# Patient Record
Sex: Male | Born: 1945 | Race: White | Hispanic: Yes | Smoking: Never smoker
Health system: Southern US, Community
[De-identification: ages and names within clinical notes are randomized; demographics above are authoritative.]

## PROBLEM LIST (undated history)

## (undated) DIAGNOSIS — I10 Essential (primary) hypertension: Secondary | ICD-10-CM

## (undated) DIAGNOSIS — I255 Ischemic cardiomyopathy: Secondary | ICD-10-CM

## (undated) DIAGNOSIS — E119 Type 2 diabetes mellitus without complications: Secondary | ICD-10-CM

## (undated) DIAGNOSIS — I2510 Atherosclerotic heart disease of native coronary artery without angina pectoris: Secondary | ICD-10-CM

## (undated) DIAGNOSIS — I350 Nonrheumatic aortic (valve) stenosis: Secondary | ICD-10-CM

## (undated) DIAGNOSIS — I639 Cerebral infarction, unspecified: Secondary | ICD-10-CM

## (undated) DIAGNOSIS — E785 Hyperlipidemia, unspecified: Secondary | ICD-10-CM

---

## 2014-02-28 ENCOUNTER — Encounter (HOSPITAL_BASED_OUTPATIENT_CLINIC_OR_DEPARTMENT_OTHER): Payer: Self-pay

## 2014-02-28 ENCOUNTER — Emergency Department (HOSPITAL_BASED_OUTPATIENT_CLINIC_OR_DEPARTMENT_OTHER): Payer: Medicare Other

## 2014-02-28 ENCOUNTER — Inpatient Hospital Stay (HOSPITAL_BASED_OUTPATIENT_CLINIC_OR_DEPARTMENT_OTHER)
Admission: EM | Admit: 2014-02-28 | Discharge: 2014-03-05 | DRG: 247 | Disposition: A | Discharge status: Skilled Nursing Facility | Payer: Medicare Other | Attending: Cardiology | Admitting: Cardiology

## 2014-02-28 DIAGNOSIS — I350 Nonrheumatic aortic (valve) stenosis: Secondary | ICD-10-CM

## 2014-02-28 DIAGNOSIS — I255 Ischemic cardiomyopathy: Secondary | ICD-10-CM | POA: Diagnosis present

## 2014-02-28 DIAGNOSIS — I214 Non-ST elevation (NSTEMI) myocardial infarction: Principal | ICD-10-CM | POA: Diagnosis present

## 2014-02-28 DIAGNOSIS — Z8673 Personal history of transient ischemic attack (TIA), and cerebral infarction without residual deficits: Secondary | ICD-10-CM | POA: Diagnosis not present

## 2014-02-28 DIAGNOSIS — Z79899 Other long term (current) drug therapy: Secondary | ICD-10-CM | POA: Diagnosis not present

## 2014-02-28 DIAGNOSIS — I420 Dilated cardiomyopathy: Secondary | ICD-10-CM | POA: Diagnosis present

## 2014-02-28 DIAGNOSIS — R1310 Dysphagia, unspecified: Secondary | ICD-10-CM | POA: Diagnosis present

## 2014-02-28 DIAGNOSIS — I252 Old myocardial infarction: Secondary | ICD-10-CM

## 2014-02-28 DIAGNOSIS — Z7982 Long term (current) use of aspirin: Secondary | ICD-10-CM | POA: Diagnosis not present

## 2014-02-28 DIAGNOSIS — E119 Type 2 diabetes mellitus without complications: Secondary | ICD-10-CM | POA: Diagnosis present

## 2014-02-28 DIAGNOSIS — Z993 Dependence on wheelchair: Secondary | ICD-10-CM | POA: Diagnosis not present

## 2014-02-28 DIAGNOSIS — R079 Chest pain, unspecified: Secondary | ICD-10-CM

## 2014-02-28 DIAGNOSIS — I639 Cerebral infarction, unspecified: Secondary | ICD-10-CM | POA: Diagnosis present

## 2014-02-28 DIAGNOSIS — I10 Essential (primary) hypertension: Secondary | ICD-10-CM | POA: Diagnosis present

## 2014-02-28 DIAGNOSIS — E785 Hyperlipidemia, unspecified: Secondary | ICD-10-CM

## 2014-02-28 DIAGNOSIS — R262 Difficulty in walking, not elsewhere classified: Secondary | ICD-10-CM | POA: Diagnosis present

## 2014-02-28 DIAGNOSIS — I160 Hypertensive urgency: Secondary | ICD-10-CM

## 2014-02-28 DIAGNOSIS — G8190 Hemiplegia, unspecified affecting unspecified side: Secondary | ICD-10-CM | POA: Diagnosis present

## 2014-02-28 DIAGNOSIS — I25110 Atherosclerotic heart disease of native coronary artery with unstable angina pectoris: Secondary | ICD-10-CM | POA: Diagnosis present

## 2014-02-28 DIAGNOSIS — R001 Bradycardia, unspecified: Secondary | ICD-10-CM | POA: Diagnosis present

## 2014-02-28 DIAGNOSIS — I2510 Atherosclerotic heart disease of native coronary artery without angina pectoris: Secondary | ICD-10-CM | POA: Diagnosis present

## 2014-02-28 DIAGNOSIS — I249 Acute ischemic heart disease, unspecified: Secondary | ICD-10-CM

## 2014-02-28 HISTORY — DX: Type 2 diabetes mellitus without complications: E11.9

## 2014-02-28 HISTORY — DX: Atherosclerotic heart disease of native coronary artery without angina pectoris: I25.10

## 2014-02-28 HISTORY — DX: Hyperlipidemia, unspecified: E78.5

## 2014-02-28 HISTORY — DX: Nonrheumatic aortic (valve) stenosis: I35.0

## 2014-02-28 HISTORY — DX: Cerebral infarction, unspecified: I63.9

## 2014-02-28 HISTORY — DX: Ischemic cardiomyopathy: I25.5

## 2014-02-28 HISTORY — DX: Essential (primary) hypertension: I10

## 2014-02-28 LAB — COMPREHENSIVE METABOLIC PANEL
ALBUMIN: 4.7 g/dL (ref 3.5–5.2)
ALK PHOS: 116 U/L (ref 39–117)
ALT: 17 U/L (ref 0–53)
ANION GAP: 6 (ref 5–15)
AST: 24 U/L (ref 0–37)
BUN: 16 mg/dL (ref 6–23)
CALCIUM: 9.7 mg/dL (ref 8.4–10.5)
CHLORIDE: 102 meq/L (ref 96–112)
CO2: 31 mmol/L (ref 19–32)
CREATININE: 0.64 mg/dL (ref 0.50–1.35)
GFR calc Af Amer: >90 mL/min (ref >90)
GFR calc non Af Amer: >90 mL/min (ref >90)
Glucose, Bld: 99 mg/dL (ref 70–99)
Potassium: 4 mmol/L (ref 3.5–5.1)
SODIUM: 139 mmol/L (ref 135–145)
TOTAL PROTEIN: 9.1 g/dL — AB (ref 6.0–8.3)
Total Bilirubin: 0.8 mg/dL (ref 0.3–1.2)

## 2014-02-28 LAB — CBC
HCT: 39.5 % (ref 39.0–52.0)
HEMOGLOBIN: 13 g/dL (ref 13.0–17.0)
MCH: 27.9 pg (ref 26.0–34.0)
MCHC: 32.9 g/dL (ref 30.0–36.0)
MCV: 84.8 fL (ref 78.0–100.0)
Platelets: 223 10*3/uL (ref 150–400)
RBC: 4.66 MIL/uL (ref 4.22–5.81)
RDW: 15.7 % — ABNORMAL HIGH (ref 11.5–15.5)
WBC: 10.4 10*3/uL (ref 4.0–10.5)

## 2014-02-28 LAB — TROPONIN I
Troponin I: 0.08 ng/mL — ABNORMAL HIGH (ref <0.031)
Troponin I: 0.06 ng/mL — ABNORMAL HIGH (ref <0.031)

## 2014-02-28 LAB — CK TOTAL AND CKMB (NOT AT ARMC)
CK TOTAL: 46 U/L (ref 7–232)
CK, MB: 1.7 ng/mL (ref 0.3–4.0)
Relative Index: INVALID (ref 0.0–2.5)

## 2014-02-28 MED ORDER — NITROGLYCERIN 0.4 MG SL SUBL
0.4000 mg | SUBLINGUAL_TABLET | SUBLINGUAL | Status: DC | PRN
  Administered 2014-02-28: 0.4 mg via SUBLINGUAL

## 2014-02-28 MED ORDER — ATORVASTATIN CALCIUM 80 MG PO TABS
80.0000 mg | ORAL_TABLET | Freq: Every day | ORAL | Status: DC
  Administered 2014-03-01 – 2014-03-04 (×4): 80 mg via ORAL
  Filled 2014-02-28 (×5): qty 1

## 2014-02-28 MED ORDER — ACETAMINOPHEN 325 MG PO TABS: 650.0000 mg | ORAL_TABLET | ORAL | Status: DC | PRN

## 2014-02-28 MED ORDER — NITROGLYCERIN 0.4 MG SL SUBL: 0.4000 mg | SUBLINGUAL_TABLET | SUBLINGUAL | Status: DC | PRN

## 2014-02-28 MED ORDER — NITROGLYCERIN IN D5W 200-5 MCG/ML-% IV SOLN
INTRAVENOUS | Status: AC
  Filled 2014-02-28: qty 250

## 2014-02-28 MED ORDER — HYDRALAZINE HCL 25 MG PO TABS
12.5000 mg | ORAL_TABLET | Freq: Three times a day (TID) | ORAL | Status: DC
Start: 2014-03-01 — End: 2014-03-05
  Administered 2014-03-01 – 2014-03-05 (×12): 12.5 mg via ORAL
  Filled 2014-02-28: qty 0.5
  Filled 2014-02-28: qty 1
  Filled 2014-02-28 (×3): qty 0.5
  Filled 2014-02-28 (×3): qty 1
  Filled 2014-02-28: qty 0.5
  Filled 2014-02-28: qty 1
  Filled 2014-02-28 (×3): qty 0.5
  Filled 2014-02-28 (×2): qty 1
  Filled 2014-02-28: qty 0.5

## 2014-02-28 MED ORDER — ONDANSETRON HCL 4 MG/2ML IJ SOLN: 4.0000 mg | Freq: Four times a day (QID) | INTRAMUSCULAR | Status: DC | PRN

## 2014-02-28 MED ORDER — CLOPIDOGREL BISULFATE 75 MG PO TABS
600.0000 mg | ORAL_TABLET | Freq: Once | ORAL | Status: AC
  Administered 2014-02-28: 600 mg via ORAL
  Filled 2014-02-28 (×2): qty 8

## 2014-02-28 MED ORDER — SERTRALINE HCL 100 MG PO TABS
100.0000 mg | ORAL_TABLET | Freq: Every day | ORAL | Status: DC
  Administered 2014-03-01 – 2014-03-05 (×5): 100 mg via ORAL
  Filled 2014-02-28 (×5): qty 1

## 2014-02-28 MED ORDER — HEPARIN (PORCINE) IN NACL 100-0.45 UNIT/ML-% IJ SOLN
INTRAMUSCULAR | Status: AC
  Filled 2014-02-28: qty 250

## 2014-02-28 MED ORDER — HYDRALAZINE HCL 25 MG PO TABS
25.0000 mg | ORAL_TABLET | Freq: Three times a day (TID) | ORAL | Status: DC
  Administered 2014-02-28: 12.5 mg via ORAL
  Filled 2014-02-28: qty 1

## 2014-02-28 MED ORDER — ASPIRIN 81 MG PO CHEW
324.0000 mg | CHEWABLE_TABLET | Freq: Once | ORAL | Status: AC
  Administered 2014-02-28: 324 mg via ORAL

## 2014-02-28 MED ORDER — ENALAPRIL MALEATE 20 MG PO TABS
20.0000 mg | ORAL_TABLET | Freq: Two times a day (BID) | ORAL | Status: DC
  Administered 2014-02-28 – 2014-03-05 (×9): 20 mg via ORAL
  Filled 2014-02-28: qty 1
  Filled 2014-02-28: qty 4
  Filled 2014-02-28 (×2): qty 1
  Filled 2014-02-28: qty 4
  Filled 2014-02-28: qty 1
  Filled 2014-02-28 (×3): qty 4
  Filled 2014-02-28: qty 1
  Filled 2014-02-28: qty 8
  Filled 2014-02-28: qty 4

## 2014-02-28 MED ORDER — NITROGLYCERIN IN D5W 200-5 MCG/ML-% IV SOLN
5.0000 ug/min | Freq: Once | INTRAVENOUS | Status: AC
  Administered 2014-02-28: 5 ug/min via INTRAVENOUS

## 2014-02-28 MED ORDER — HEPARIN BOLUS VIA INFUSION
4000.0000 [IU] | Freq: Once | INTRAVENOUS | Status: AC
  Administered 2014-02-28 (×2): 4000 [IU] via INTRAVENOUS

## 2014-02-28 MED ORDER — ASPIRIN EC 81 MG PO TBEC
81.0000 mg | DELAYED_RELEASE_TABLET | Freq: Every day | ORAL | Status: DC
  Administered 2014-03-01 – 2014-03-05 (×5): 81 mg via ORAL
  Filled 2014-02-28 (×5): qty 1

## 2014-02-28 MED ORDER — CLOPIDOGREL BISULFATE 75 MG PO TABS
75.0000 mg | ORAL_TABLET | Freq: Every day | ORAL | Status: DC
  Administered 2014-03-01 – 2014-03-03 (×3): 75 mg via ORAL
  Filled 2014-02-28 (×3): qty 1

## 2014-02-28 MED ORDER — ASPIRIN 81 MG PO CHEW
CHEWABLE_TABLET | ORAL | Status: AC
  Filled 2014-02-28: qty 4

## 2014-02-28 MED ORDER — NITROGLYCERIN 0.4 MG SL SUBL
SUBLINGUAL_TABLET | SUBLINGUAL | Status: AC
  Filled 2014-02-28: qty 1

## 2014-02-28 MED ORDER — HEPARIN (PORCINE) IN NACL 100-0.45 UNIT/ML-% IJ SOLN
1100.0000 [IU]/h | INTRAMUSCULAR | Status: DC
  Administered 2014-02-28: 900 [IU]/h via INTRAVENOUS
  Administered 2014-03-01: 1100 [IU]/h via INTRAVENOUS
  Filled 2014-02-28 (×3): qty 250

## 2014-02-28 MED ORDER — ALLOPURINOL 300 MG PO TABS
300.0000 mg | ORAL_TABLET | Freq: Every day | ORAL | Status: DC
  Administered 2014-03-01 – 2014-03-05 (×5): 300 mg via ORAL
  Filled 2014-02-28 (×5): qty 1

## 2014-02-28 NOTE — ED Notes (Signed)
Attempt to call report nurse not available 

## 2014-02-28 NOTE — ED Notes (Signed)
MD at bedside. 

## 2014-02-28 NOTE — ED Provider Notes (Addendum)
CSN: [id number]     Arrival date & time 02/28/14  1609 History   First MD Initiated Contact with Patient 02/28/14 1640     Chief Complaint  Patient presents with  . Chest Pain     (Consider location/radiation/quality/duration/timing/severity/associated sxs/prior Treatment) HPI Comments: Patient presents to the ER for evaluation of chest pain. Patient does reportedly have a history of MI. He reports he was living in [place] when that occurred, never had stenting or other intervention. [place], has not established with a cardiologist but does have a primary care doctor.  Patient reportedly had an episode of severe pain in his back several weeks ago. It lasted for several hours and then resolved. He did not seek care at that time. Patient then had recurrence of pain yesterday which was severe, more in the chest. Associated with some mild shortness of breath. It lasted through the night, but has gotten better today. At time of arrival to the ER, patient denies any chest pain.  Patient is a 69 y.o. male presenting with chest pain.  Chest Pain Associated symptoms: shortness of breath     Past Medical History  Diagnosis Date  . Stroke     x 2  . Hypertension   . Diabetes mellitus without complication    History reviewed. No pertinent past surgical history. No family history on file. History  Substance Use Topics  . Smoking status: Never Smoker   . Smokeless tobacco: Not on file  . Alcohol Use: No    Review of Systems  Respiratory: Positive for shortness of breath.   Cardiovascular: Positive for chest pain.  All other systems reviewed and are negative.     Allergies  Review of patient's allergies indicates no known allergies.  Home Medications   Prior to Admission medications   Medication Sig Start Date End Date Taking? Authorizing Provider  aspirin EC 81 MG tablet Take 81 mg by mouth daily.   Yes Historical Provider, MD  propantheline  (PROBANTHINE) 15 MG tablet Take 15 mg by mouth 3 (three) times daily with meals.   Yes Historical Provider, MD   BP 199/67 mmHg  Pulse 50  Temp(Src) 97.9 F (36.6 C) (Oral)  Resp 16  Ht 5\' 8"  (1.727 m)  Wt 160 lb (72.576 kg)  BMI 24.33 kg/m2  SpO2 100% Physical Exam  Constitutional: He is oriented to person, place, and time. He appears well-developed and well-nourished. No distress.  HENT:  Head: Normocephalic and atraumatic.  Right Ear: Hearing normal.  Left Ear: Hearing normal.  Nose: Nose normal.  Mouth/Throat: Oropharynx is clear and moist and mucous membranes are normal.  Eyes: Conjunctivae and EOM are normal. Pupils are equal, round, and reactive to light.  Neck: Normal range of motion. Neck supple.  Cardiovascular: Regular rhythm, S1 normal and S2 normal.  Exam reveals no gallop and no friction rub.   No murmur heard. Pulmonary/Chest: Effort normal and breath sounds normal. No respiratory distress. He exhibits no tenderness.  Abdominal: Soft. Normal appearance and bowel sounds are normal. There is no hepatosplenomegaly. There is no tenderness. There is no rebound, no guarding, no tenderness at McBurney's point and negative Murphy's sign. No hernia.  Musculoskeletal: Normal range of motion.  Neurological: He is alert and oriented to person, place, and time. He has normal strength. No cranial nerve deficit or sensory deficit. Coordination normal. GCS eye subscore is 4. GCS verbal subscore is 5. GCS motor subscore is 6.  Skin: Skin is warm, dry and intact. No rash noted. No cyanosis.  Psychiatric: He has a normal mood and affect. His speech is normal and behavior is normal. Thought content normal.  Nursing note and vitals reviewed.   ED Course  Procedures (including critical care time) Labs Review Labs Reviewed  CBC - Abnormal; Notable for the following:    RDW 15.7 (*)    All other components within normal limits  COMPREHENSIVE METABOLIC PANEL - Abnormal; Notable for the  following:    Total Protein 9.1 (*)    All other components within normal limits  TROPONIN I - Abnormal; Notable for the following:    Troponin I 0.08 (*)    All other components within normal limits    Imaging Review Dg Chest Port 1 View  02/28/2014   CLINICAL DATA:  Chest pain and difficulty breathing for 1 day  EXAM: PORTABLE CHEST - 1 VIEW  COMPARISON:  None.  FINDINGS: There is mild left base atelectasis. Elsewhere lungs are clear. Heart is upper normal in size with pulmonary vascularity within normal limits. No adenopathy. There is atherosclerotic change in the aortic arch region. There is degenerative change in the thoracic spine.  IMPRESSION: Mild left base atelectasis.  No edema or consolidation.   Electronically Signed   By: Bretta Bang M.D.   On: 02/28/2014 17:17     EKG Interpretation   Date/Time:  Friday February 28 2014 16:22:21 EST Ventricular Rate:  54 PR Interval:  204 QRS Duration: 122 QT Interval:  476 QTC Calculation: 451 R Axis:   -7 Text Interpretation:  Sinus bradycardia Left ventricular hypertrophy with  QRS widening and repolarization abnormality Inferior infarct , age  undetermined Abnormal ECG Confirmed by Alantra Popoca  MD, Mahdiya Mossberg [phone]) on  02/28/2014 4:36:26 PM      MDM   Final diagnoses:  Chest pain  NSTEMI or subacute MI  Patient presents to the ER for evaluation of chest pain. He has had 2 episodes of fairly severe chest pain over the last several weeks, most recently was yesterday. His symptoms improved today and he arrived without any active chest pain. He does have a history of MI, however. EKG revealed left ventricular hypertrophy with QRS widening and repolarization abnormality. There was, however, concern for subtle ST changes in inferior leads, specifically borderline elevations but there are associated Q waves. I suspect that the patient had a cardiac event yesterday or possibly even several weeks ago when he had the other episode of  pain. His troponin is elevated here today. He was also very hypertensive upon arrival. This was treated with nitroglycerin. Patient is now on a nitroglycerin drip and IV heparin, will require transfer to [facility] for further management by cardiology.  CRITICAL CARE Performed by: Gilda Crease   Total critical care time:  Critical care time was exclusive of separately billable procedures and treating other patients.  Critical care was necessary to treat or prevent imminent or life-threatening deterioration.  Critical care was time spent personally by me on the following activities: development of treatment plan with patient and/or surrogate as well as nursing, discussions with consultants, evaluation of patient's response to treatment, examination of patient, obtaining history from patient or surrogate, ordering and performing treatments and interventions, ordering and review of laboratory studies, ordering and review of radiographic studies, pulse oximetry and re-evaluation of patient's condition.   Gilda Crease, MD 02/28/14 Silva Bandy  Gilda Crease, MD 02/28/14 [phone]

## 2014-02-28 NOTE — ED Notes (Signed)
CP since last night. Some SHOB. Hx 2 strokes, weakness to left side

## 2014-02-28 NOTE — H&P (Signed)
HPI: [name] is a 19M with CAD s/p MI without revascularization, PVD s/p RLE revascularization, HTN, and prior CVA with residual L sided hemiparesis here with NSTEMI.  [name] reports feeling back pain intermittently for 2 weeks.  One day prior to presentation he also noted chest pain that was associated with the back pain.  On the day of presentation he had an episode of isolated chest pain that prompted him to be evaluated at [facility].  Each episode lasted for approximately 30 minutes and was a 6/10 pulsing sensation.  It did not feel like his prior MI's in 2012.  Per the patient, these occurred in [place] and he underwent heart catheterization but did not receive a stent or subsequent surgery.  Since his CVA in 2012, [name] is unable to ambulate and he cannot push himself in a wheelchair.  Therefore, he cannot assess whether the pain is exertional.  He did not try taking nitroglycerin.  He endorses some associated SOB but denies nausea, vomiting or diaphoresis.    Upon arrival in [facility] [name] was hypertensive to 212/64 with a HR of 54.  He was started on a nitroglycerin infusion and received heparin and aspirin for his NSTEMI.  He did not take any of his home medications today.  His daughter provided a photo of his pill bottles.  [name] daughter thinks that he is prescribed simvastatin, though this is not among the medications in the photo.  He is unsure whether he has been taking a statin.  [name] is very concerned about his inability to ambulate and swallow properly since his stroke.  These are chronic issues and have not worsened recently.  He underwent physical therapy and speech therapy in both [place] and [place].  He drinks nectar thick liquids and does not take any solid foods.  [name] speaks mostly Spanish and some English.   Review of Systems:     Cardiac Review of Systems: {Y] = yes  = no  Chest Pain [x    ]  Resting SOB  [   ] Exertional SOB  [  ]  Orthopnea [  ]   Pedal Edema [   ]    Palpitations [  ] Syncope  [  ]   Presyncope [   ]  General Review of Systems: [Y] = yes [  ]=no Constitional: recent weight change [  ]; anorexia [  ]; fatigue [  ]; nausea [  ]; night sweats [  ]; fever [  ]; or chills [  ];                                                                                                                                          Dental: poor dentition[  ];   Eye : blurred vision [  ];  diplopia [   ]; vision changes [  ];  Amaurosis fugax[  ]; Resp: cough [  ];  wheezing[  ];  hemoptysis[  ]; shortness of breath[  ]; paroxysmal nocturnal dyspnea[  ]; dyspnea on exertion[  ]; or orthopnea[  ];  GI:  gallstones[  ], vomiting[  ];  dysphagia[  ]; melena[  ];  hematochezia [  ]; heartburn[  ];   Hx of  Colonoscopy[  ]; GU: kidney stones [  ]; hematuria[  ];   dysuria [  ];  nocturia[  ];  history of     obstruction [  ];                 Skin: rash, swelling[  ];, hair loss[  ];  peripheral edema[  ];  or itching[  ]; Musculosketetal: myalgias[  ];  joint swelling[  ];  joint erythema[  ];  joint pain[  ];  back pain[  ];  Heme/Lymph: bruising[  ];  bleeding[  ];  anemia[  ];  Neuro: TIA[  ];  headaches[  ];  stroke[  ];  vertigo[  ];  seizures[  ];   paresthesias[  ];  difficulty walking[x  ]; L sided paralysis  Psych:depression[ x ]; anxiety[  ];  Endocrine: diabetes[  ];  thyroid dysfunction[  ];  Immunizations: Flu [  ]; Pneumococcal[  ];  Other:  Past Medical History  Diagnosis Date  . Stroke     x 2  . Hypertension   . Diabetes mellitus without complication     Medications Prior to Admission  Medication Sig Dispense Refill  . aspirin EC 81 MG tablet Take 81 mg by mouth daily.    . propantheline (PROBANTHINE) 15 MG tablet Take 15 mg by mouth 3 (three) times daily with meals.    Sertraline  daily Enalapril  daily Benzonatate prn Allopurinol  daily Metoprolol succinate dose  unknown Aspirin  daily  Possibly simvastating  No Known Allergies  History   Social History  . Marital Status: Divorced    Spouse Name: N/A    Number of Children: N/A  . Years of Education: N/A   Occupational History  . Not on file.   Social History Main Topics  . Smoking status: Never Smoker   . Smokeless tobacco: Not on file  . Alcohol Use: No  . Drug Use: Not on file  . Sexual Activity: Not on file   Other Topics Concern  . Not on file   Social History Narrative  . No narrative on file    No family history on file.  PHYSICAL EXAM: Filed Vitals:   02/28/14 2100  BP: 174/50  Pulse: 51  Temp: 97.8 F (36.6 C)  Resp: 16   General:  Well appearing. No respiratory difficulty HEENT: normal Neck: supple. no JVD. Carotids 2+ bilat; no bruit No lymphadenopathy or thryomegaly appreciated. Cor: PMI nondisplaced. Bradycardic.  Regular rhythm.  III/VI early-peaking SEM at RUSB with radiation to bilateral carotids. No rubs, gallops. Lungs: CTAB.  No crackles, rhonchi or wheezes. Abdomen: soft, nontender, nondistended. No hepatosplenomegaly. No bruits or masses. Good bowel sounds. Extremities: no cyanosis, clubbing, rash, edema Neuro: alert & oriented x 3, cranial nerves grossly intact. L upper and lower extremity paralysis.  Affect pleasant.  ECG: Sinus bradycardia at 54bpm.  Inferior age indeterminate MI.  LVH with repolarization abnormalities.   Results for orders placed or performed during the hospital encounter of 02/28/14 (from the past  24 hour(s))  CBC     Status: Abnormal   Collection Time: 02/28/14  4:53 PM  Result Value Ref Range   WBC 10.4 4.0 - 10.5 K/uL   RBC 4.66 4.22 - 5.81 MIL/uL   Hemoglobin 13.0 13.0 - 17.0 g/dL   HCT 16.1 09.6 - 04.5 %   MCV 84.8 78.0 - 100.0 fL   MCH 27.9 26.0 - 34.0 pg   MCHC 32.9 30.0 - 36.0 g/dL   RDW 40.9 (H) 81.1 - 91.4 %   Platelets 223 150 - 400 K/uL  Comprehensive metabolic panel     Status: Abnormal   Collection  Time: 02/28/14  4:53 PM  Result Value Ref Range   Sodium 139 135 - 145 mmol/L   Potassium 4.0 3.5 - 5.1 mmol/L   Chloride 102 96 - 112 mEq/L   CO2 31 19 - 32 mmol/L   Glucose, Bld 99 70 - 99 mg/dL   BUN 16 6 - 23 mg/dL   Creatinine, Ser 7.82 0.50 - 1.35 mg/dL   Calcium 9.7 8.4 - 95.6 mg/dL   Total Protein 9.1 (H) 6.0 - 8.3 g/dL   Albumin 4.7 3.5 - 5.2 g/dL   AST 24 0 - 37 U/L   ALT 17 0 - 53 U/L   Alkaline Phosphatase 116 39 - 117 U/L   Total Bilirubin 0.8 0.3 - 1.2 mg/dL   GFR calc non Af Amer >90 >90 mL/min   GFR calc Af Amer >90 >90 mL/min   Anion gap 6 5 - 15  Troponin I     Status: Abnormal   Collection Time: 02/28/14  4:53 PM  Result Value Ref Range   Troponin I 0.08 (H) <0.031 ng/mL   Dg Chest Port 1 View  02/28/2014   CLINICAL DATA:  Chest pain and difficulty breathing for 1 day  EXAM: PORTABLE CHEST - 1 VIEW  COMPARISON:  None.  FINDINGS: There is mild left base atelectasis. Elsewhere lungs are clear. Heart is upper normal in size with pulmonary vascularity within normal limits. No adenopathy. There is atherosclerotic change in the aortic arch region. There is degenerative change in the thoracic spine.  IMPRESSION: Mild left base atelectasis.  No edema or consolidation.   Electronically Signed   By: Bretta Bang DylanD.   On: 02/28/2014 17:17     ASSESSMENT: 28M with CAD s/p MI without revascularization, PVD s/p RLE revascularization, HTN, and prior CVA with residual L sided hemiparesis here with NSTEMI.    PLAN/DISCUSSION: #NSTEMI:  Currently CP free.  ECG shows an age indeterminate inferior infarct.  It is very unclear what happened with [name] prior MI's in [place] as he is somewhat of a poor historian and his daughter does not know the details.  Likely, this acute event occurred at some point over the last 48 hours.  WIll repeat troponin and check CK-MB to better assess the timing.  Given his back pain there is also concern for aortic dissection.   However,there is no evidence of mediastinal widening and radial pulses are symmetrical, making this diagnosis unlikely.  His NSTEMI could also be 2/2 hypertensive emergency as he was quite hypertensive on presentation. - ASA 81mg  daily - Hold home metoprolol succinate 2/2 bradycardia.  Reassess tomorrow - Increase home enalapril 20mg  daily to BID while holding Bblocker and pt is hypertensive - Hydralazine 12.5mg  po x1 - Wean nitroglycerin infusion as tolerated - Atorvastatin 80mg  - TTE for LV function, wall motion and assessment of murmur -  [facility] - repeat troponin x1 and check CK-MB - check lipids/Hgb A1c  # Murmur: Mr. Nelida Malone has a murmur consistent with mild-moderate AS.  He has no HF symptoms - TTE as above  # Hemiplegia: Patient requests PT/OT to help improve mobility  # Code: Full

## 2014-02-28 NOTE — Progress Notes (Signed)
  ANTICOAGULATION CONSULT NOTE - Initial Consult  Pharmacy Consult for heparin gtt Indication: chest pain/ACS  No Known Allergies  Patient Measurements: Height: 5\' 8"  (172.7 cm) Weight: 160 lb (72.576 kg) IBW/kg (Calculated) : 68.4 Heparin Dosing Weight:   Vital Signs: Temp: 97.9 F (36.6 C) (01/08 1616) Temp Source: Oral (01/08 1616) BP: 199/67 mmHg (01/08 1821) Pulse Rate: 50 (01/08 1821)  Labs:  Recent Labs  02/28/14 1653  HGB 13.0  HCT 39.5  PLT 223  CREATININE 0.64  TROPONINI 0.08*    Estimated Creatinine Clearance: 83.1 mL/min (by C-G formula based on Cr of 0.64).   Medical History: Past Medical History  Diagnosis Date  . Stroke     x 2  . Hypertension   . Diabetes mellitus without complication     Medications:   (Not in a hospital admission)  Assessment: 69 yo with chest pain and SOB. Rule out ACS. CBC stable and wnl, no anticoagulants PTA, eCrCl > 80 ml/min.  Goal of Therapy:  Heparin level 0.3-0.7 units/ml Monitor platelets by anticoagulation protocol: Yes   Plan:  Heparin 4000 units x1, gtt 900 units/hr Daily HL, CBC Check HL at 0100 Monitor for s/sx bleeding  Agapito GamesAlison Harvey Lingo, PharmD, BCPS Clinical Pharmacist Pager: [phone] 02/28/2014 6:49 PM

## 2014-03-01 DIAGNOSIS — I160 Hypertensive urgency: Secondary | ICD-10-CM

## 2014-03-01 DIAGNOSIS — I214 Non-ST elevation (NSTEMI) myocardial infarction: Principal | ICD-10-CM

## 2014-03-01 DIAGNOSIS — E785 Hyperlipidemia, unspecified: Secondary | ICD-10-CM

## 2014-03-01 DIAGNOSIS — I249 Acute ischemic heart disease, unspecified: Secondary | ICD-10-CM

## 2014-03-01 DIAGNOSIS — I10 Essential (primary) hypertension: Secondary | ICD-10-CM

## 2014-03-01 DIAGNOSIS — I359 Nonrheumatic aortic valve disorder, unspecified: Secondary | ICD-10-CM

## 2014-03-01 LAB — HEMOGLOBIN A1C
HEMOGLOBIN A1C: 5.4 % (ref <5.7)
Mean Plasma Glucose: 108 mg/dL (ref <117)

## 2014-03-01 LAB — CBC
HCT: 33.3 % — ABNORMAL LOW (ref 39.0–52.0)
Hemoglobin: 11.1 g/dL — ABNORMAL LOW (ref 13.0–17.0)
MCH: 27.5 pg (ref 26.0–34.0)
MCHC: 33.3 g/dL (ref 30.0–36.0)
MCV: 82.6 fL (ref 78.0–100.0)
Platelets: 201 10*3/uL (ref 150–400)
RBC: 4.03 MIL/uL — AB (ref 4.22–5.81)
RDW: 15.8 % — ABNORMAL HIGH (ref 11.5–15.5)
WBC: 7.8 10*3/uL (ref 4.0–10.5)

## 2014-03-01 LAB — HEPARIN LEVEL (UNFRACTIONATED)
HEPARIN UNFRACTIONATED: 0.23 [IU]/mL — AB (ref 0.30–0.70)
HEPARIN UNFRACTIONATED: 0.5 [IU]/mL (ref 0.30–0.70)
Heparin Unfractionated: 0.4 IU/mL (ref 0.30–0.70)

## 2014-03-01 LAB — LIPID PANEL
CHOLESTEROL: 205 mg/dL — AB (ref 0–200)
HDL: 25 mg/dL — ABNORMAL LOW (ref >39)
LDL CALC: 150 mg/dL — AB (ref 0–99)
Total CHOL/HDL Ratio: 8.2 RATIO
Triglycerides: 150 mg/dL — ABNORMAL HIGH (ref <150)
VLDL: 30 mg/dL (ref 0–40)

## 2014-03-01 LAB — BASIC METABOLIC PANEL
Anion gap: 11 (ref 5–15)
BUN: 12 mg/dL (ref 6–23)
CO2: 25 mmol/L (ref 19–32)
CREATININE: 0.69 mg/dL (ref 0.50–1.35)
Calcium: 9.3 mg/dL (ref 8.4–10.5)
Chloride: 103 mEq/L (ref 96–112)
GFR calc Af Amer: >90 mL/min (ref >90)
Glucose, Bld: 110 mg/dL — ABNORMAL HIGH (ref 70–99)
Potassium: 3.5 mmol/L (ref 3.5–5.1)
Sodium: 139 mmol/L (ref 135–145)

## 2014-03-01 LAB — TROPONIN I: TROPONIN I: 0.06 ng/mL — AB (ref <0.031)

## 2014-03-01 LAB — MRSA PCR SCREENING: MRSA BY PCR: POSITIVE — AB

## 2014-03-01 MED ORDER — CHLORHEXIDINE GLUCONATE CLOTH 2 % EX PADS
6.0000 | MEDICATED_PAD | Freq: Every day | CUTANEOUS | Status: DC
  Administered 2014-03-02 – 2014-03-04 (×3): 6 via TOPICAL

## 2014-03-01 MED ORDER — MUPIROCIN 2 % EX OINT
1.0000 "application " | TOPICAL_OINTMENT | Freq: Two times a day (BID) | CUTANEOUS | Status: AC
  Administered 2014-03-01 – 2014-03-05 (×10): 1 via NASAL
  Filled 2014-03-01 (×3): qty 22

## 2014-03-01 MED ORDER — ISOSORBIDE MONONITRATE ER 30 MG PO TB24
30.0000 mg | ORAL_TABLET | Freq: Every day | ORAL | Status: DC
  Administered 2014-03-01 – 2014-03-05 (×5): 30 mg via ORAL
  Filled 2014-03-01 (×5): qty 1

## 2014-03-01 NOTE — Progress Notes (Signed)
Patient Name: Dylan Malone Date of Encounter: 03/01/2014  Active Problems:   ACS (acute coronary syndrome)   NSTEMI (non-ST elevated myocardial infarction)   Length of Stay: 1  SUBJECTIVE  The patient is chest pain free of NTG drip, some SOB.   CURRENT MEDS . allopurinol  300 mg Oral Daily  . aspirin EC  81 mg Oral Daily  . atorvastatin  80 mg Oral q1800  . Chlorhexidine Gluconate Cloth  6 each Topical Q0600  . clopidogrel  75 mg Oral Q breakfast  . enalapril  20 mg Oral BID  . hydrALAZINE  12.5 mg Oral 3 times per day  . mupirocin ointment  1 application Nasal BID  . sertraline  100 mg Oral Daily   . heparin 1,100 Units/hr (03/01/14 0331)   OBJECTIVE  Filed Vitals:   03/01/14 0430 03/01/14 0500 03/01/14 0530 03/01/14 0630  BP: 117/50  151/55 140/42  Pulse:      Temp:  97.8 F (36.6 C)    TempSrc:      Resp:      Height:      Weight:  160 lb (72.576 kg)    SpO2:  99%      Intake/Output Summary (Last 24 hours) at 03/01/14 1112 Last data filed at 03/01/14 0600  Gross per 24 hour  Intake 274.82 ml  Output    300 ml  Net -25.18 ml   Filed Weights   02/28/14 1616 02/28/14 2100 03/01/14 0500  Weight: 160 lb (72.576 kg) 158 lb 9.6 oz (71.94 kg) 160 lb (72.576 kg)    PHYSICAL EXAM  General: Pleasant, NAD. Neuro: Alert and oriented X 3. Moves all extremities spontaneously. Psych: Normal affect. HEENT:  Normal  Neck: Supple without bruits or JVD. Lungs:  Resp regular and unlabored, CTA. Heart: RRR no s3, s4, or murmurs. Abdomen: Soft, non-tender, non-distended, BS + x 4.  Extremities: No clubbing, cyanosis or edema. DP/PT/Radials 2+ and equal bilaterally.  Accessory Clinical Findings  CBC  Recent Labs  02/28/14 1653 03/01/14 0236  WBC 10.4 7.8  HGB 13.0 11.1*  HCT 39.5 33.3*  MCV 84.8 82.6  PLT 223 201   Basic Metabolic Panel  Recent Labs  02/28/14 1653 03/01/14 0236  NA 139 139  K 4.0 3.5  CL 102 103  CO2 31 25  GLUCOSE 99 110*  BUN  16 12  CREATININE 0.64 0.69  CALCIUM 9.7 9.3   Liver Function Tests  Recent Labs  02/28/14 1653  AST 24  ALT 17  ALKPHOS 116  BILITOT 0.8  PROT 9.1*  ALBUMIN 4.7   No results for input(s): LIPASE, AMYLASE in the last 72 hours. Cardiac Enzymes  Recent Labs  02/28/14 1653 02/28/14 2300 03/01/14 0236  CKTOTAL  --  46  --   CKMB  --  1.7  --   TROPONINI 0.08* 0.06* 0.06*    Recent Labs  03/01/14 0236  CHOL 205*  HDL 25*  LDLCALC 150*  TRIG 150*  CHOLHDL 8.2   Radiology/Studies  Dg Chest Port 1 View  02/28/2014   IMPRESSION: Mild left base atelectasis.  No edema or consolidation.   Electronically Signed   By: Bretta Bang M.D.   On: 02/28/2014 17:17   TELE: SR    ASSESSMENT AND PLAN  1. NSTEMI: Currently CP free. ECG shows an age indeterminate inferior infarct. It is very unclear what happened with Dylan Malone prior MI's in [place] as he is somewhat of a  poor historian and his daughter does not know the details. Likely, this acute event occurred at some point over the last 48 hours. Troponin is downtrending. Given his back pain there is also concern for aortic dissection. However,there is no evidence of mediastinal widening and radial pulses are symmetrical, making this diagnosis unlikely. His NSTEMI could also be 2/2 hypertensive emergency as he was quite hypertensive on presentation. - ASA 81mg  daily - Hold home metoprolol succinate 2/2 bradycardia.  - Increase home enalapril 20mg  daily to BID while holding Bblocker and pt is hypertensive - Hydralazine 12.5mg  po x1 - Atorvastatin 80mg  - TTE for LV function, wall motion and assessment of murmur - [facility] - repeat troponin x1 and check CK-MB - check lipids/Hgb A1c  2. Murmur: Dylan Malone has a murmur consistent with mild-moderate AS. He has no HF symptoms - TTE as above  3. Hemiplegia: Patient requests PT/OT to help improve mobility  # Code: Full   Signed, Lars MassonNELSON,  Jakala Herford H MD, [facility] 03/01/2014

## 2014-03-01 NOTE — Progress Notes (Signed)
  Echocardiogram 2D Echocardiogram has been performed.  Arvil ChacoFoster, [place] 03/01/2014, 1:09 PM

## 2014-03-01 NOTE — Progress Notes (Signed)
ANTICOAGULATION CONSULT NOTE - Follow Up Consult  Pharmacy Consult for Heparin Indication: chest pain/ACS  No Known Allergies  Patient Measurements: Height: 5\' 8"  (172.7 cm) Weight: 160 lb (72.576 kg) IBW/kg (Calculated) : 68.4  Vital Signs: Temp: 98.9 F (37.2 C) (01/09 1729) Temp Source: Oral (01/09 1729) BP: 174/59 mmHg (01/09 1729) Pulse Rate: 82 (01/09 1729)  Labs:  Recent Labs  02/28/14 1653 02/28/14 2300 03/01/14 0140 03/01/14 0236 03/01/14 1136 03/01/14 1731  HGB 13.0  --   --  11.1*  --   --   HCT 39.5  --   --  33.3*  --   --   PLT 223  --   --  201  --   --   HEPARINUNFRC  --   --  0.23*  --  0.50 0.40  CREATININE 0.64  --   --  0.69  --   --   CKTOTAL  --  46  --   --   --   --   CKMB  --  1.7  --   --   --   --   TROPONINI 0.08* 0.06*  --  0.06*  --   --     Estimated Creatinine Clearance: 83.1 mL/min (by C-G formula based on Cr of 0.69).   Medications:  Infusions:  . heparin 1,100 Units/hr (03/01/14 1244)    Assessment: 69 year old male on anticoagulation with heparin for NSTEMI.  His heparin level is therapeutic after rate adjustment.  Heparin level therapeutic on 1100 units/hr.   Goal of Therapy:  Heparin level 0.3-0.7 units/ml Monitor platelets by anticoagulation protocol: Yes   Plan:  Continue heparin at 1100 units/hr.  Follow-up AM level.   Link SnufferJessica Cathyann Kilfoyle, PharmD, BCPS Clinical Pharmacist [phone]  03/01/2014, 6:39 PM

## 2014-03-01 NOTE — Progress Notes (Signed)
ANTICOAGULATION CONSULT NOTE - Follow Up Consult  Pharmacy Consult for Heparin Indication: chest pain/ACS  No Known Allergies  Patient Measurements: Height: 5\' 8"  (172.7 cm) Weight: 160 lb (72.576 kg) IBW/kg (Calculated) : 68.4  Vital Signs: Temp: 97.8 F (36.6 C) (01/09 0500) BP: 135/53 mmHg (01/09 1115)  Labs:  Recent Labs  02/28/14 1653 02/28/14 2300 03/01/14 0140 03/01/14 0236 03/01/14 1136  HGB 13.0  --   --  11.1*  --   HCT 39.5  --   --  33.3*  --   PLT 223  --   --  201  --   HEPARINUNFRC  --   --  0.23*  --  0.50  CREATININE 0.64  --   --  0.69  --   CKTOTAL  --  46  --   --   --   CKMB  --  1.7  --   --   --   TROPONINI 0.08* 0.06*  --  0.06*  --     Estimated Creatinine Clearance: 83.1 mL/min (by C-G formula based on Cr of 0.69).   Medications:  Infusions:  . heparin 1,100 Units/hr (03/01/14 1244)    Assessment: 69 year old male on anticoagulation with heparin for NSTEMI.  His heparin level is therapeutic after rate adjustment.  Goal of Therapy:  Heparin level 0.3-0.7 units/ml Monitor platelets by anticoagulation protocol: Yes   Plan:  Estella HuskMichelle Breean Nannini, Pharm.D., BCPS, AAHIVP Clinical Pharmacist Phone: [phone] or [phone] 03/01/2014, 1:04 PM

## 2014-03-01 NOTE — Progress Notes (Signed)
ANTICOAGULATION CONSULT NOTE - Follow Up Consult  Pharmacy Consult for Heparin  Indication: chest pain/ACS  No Known Allergies  Patient Measurements: Height: 5\' 8"  (172.7 cm) Weight: 158 lb 9.6 oz (71.94 kg) IBW/kg (Calculated) : 68.4  Vital Signs: Temp: 97.8 F (36.6 C) (01/08 2100) Temp Source: Oral (01/08 1938) BP: 133/51 mmHg (01/09 0030) Pulse Rate: 51 (01/08 2100)  Labs:  Recent Labs  02/28/14 1653 02/28/14 2300 03/01/14 0140  HGB 13.0  --   --   HCT 39.5  --   --   PLT 223  --   --   HEPARINUNFRC  --   --  0.23*  CREATININE 0.64  --   --   CKTOTAL  --  46  --   CKMB  --  1.7  --   TROPONINI 0.08* 0.06*  --    Estimated Creatinine Clearance: 83.1 mL/min (by C-G formula based on Cr of 0.64).   Assessment: Sub-therapeutic heparin level x 1, no issues per RN.   Goal of Therapy:  Heparin level 0.3-0.7 units/ml Monitor platelets by anticoagulation protocol: Yes   Plan:  -Increase heparin drip to 1100 units/hr -1100 HL -Daily CBC/HL -Monitor for bleeding  Abran DukeLedford, Camrie Stock 03/01/2014,3:03 AM

## 2014-03-02 LAB — CBC
HCT: 34.2 % — ABNORMAL LOW (ref 39.0–52.0)
HEMOGLOBIN: 11.2 g/dL — AB (ref 13.0–17.0)
MCH: 27.1 pg (ref 26.0–34.0)
MCHC: 32.7 g/dL (ref 30.0–36.0)
MCV: 82.8 fL (ref 78.0–100.0)
PLATELETS: 193 10*3/uL (ref 150–400)
RBC: 4.13 MIL/uL — AB (ref 4.22–5.81)
RDW: 15.9 % — ABNORMAL HIGH (ref 11.5–15.5)
WBC: 8.4 10*3/uL (ref 4.0–10.5)

## 2014-03-02 LAB — HEPARIN LEVEL (UNFRACTIONATED): Heparin Unfractionated: 0.41 IU/mL (ref 0.30–0.70)

## 2014-03-02 MED ORDER — SODIUM CHLORIDE 0.9 % IV SOLN: 250.0000 mL | INTRAVENOUS | Status: DC | PRN

## 2014-03-02 MED ORDER — POTASSIUM CHLORIDE ER 10 MEQ PO TBCR
20.0000 meq | EXTENDED_RELEASE_TABLET | Freq: Once | ORAL | Status: AC
  Administered 2014-03-02: 20 meq via ORAL
  Filled 2014-03-02 (×2): qty 2

## 2014-03-02 MED ORDER — SODIUM CHLORIDE 0.9 % IJ SOLN
3.0000 mL | Freq: Two times a day (BID) | INTRAMUSCULAR | Status: DC
  Administered 2014-03-02 – 2014-03-03 (×2): 3 mL via INTRAVENOUS

## 2014-03-02 MED ORDER — SODIUM CHLORIDE 0.9 % IJ SOLN
3.0000 mL | INTRAMUSCULAR | Status: DC | PRN
Start: 2014-03-02 — End: 2014-03-03

## 2014-03-02 MED ORDER — CARVEDILOL 3.125 MG PO TABS
3.1250 mg | ORAL_TABLET | Freq: Two times a day (BID) | ORAL | Status: DC
  Administered 2014-03-02 (×2): 3.125 mg via ORAL
  Filled 2014-03-02 (×3): qty 1

## 2014-03-02 MED ORDER — ASPIRIN 81 MG PO CHEW
81.0000 mg | CHEWABLE_TABLET | ORAL | Status: AC
  Administered 2014-03-03: 81 mg via ORAL
  Filled 2014-03-02: qty 1

## 2014-03-02 NOTE — Progress Notes (Signed)
Patient Name: Dylan Malone Date of Encounter: 03/02/2014  Active Problems:   ACS (acute coronary syndrome)   NSTEMI (non-ST elevated myocardial infarction)   Hypertensive urgency   Hyperlipidemia   Acute coronary syndrome   Chest pain   Length of Stay: 2  SUBJECTIVE  The patient is chest pain free of NTG drip, some SOB.   CURRENT MEDS . allopurinol  300 mg Oral Daily  . aspirin EC  81 mg Oral Daily  . atorvastatin  80 mg Oral q1800  . Chlorhexidine Gluconate Cloth  6 each Topical Q0600  . clopidogrel  75 mg Oral Q breakfast  . enalapril  20 mg Oral BID  . hydrALAZINE  12.5 mg Oral 3 times per day  . isosorbide mononitrate  30 mg Oral Daily  . mupirocin ointment  1 application Nasal BID  . sertraline  100 mg Oral Daily   . heparin 1,100 Units/hr (03/01/14 1244)   OBJECTIVE  Filed Vitals:   03/01/14 1600 03/01/14 1729 03/01/14 2100 03/02/14 0500  BP: 112/54 174/59 143/62 161/64  Pulse:  82 81 89  Temp:  98.9 F (37.2 C) 97.5 F (36.4 C) 98 F (36.7 C)  TempSrc:  Oral    Resp:  Height:      Weight:    154 lb 6.4 oz (70.035 kg)  SpO2:  97% 97% 94%    Intake/Output Summary (Last 24 hours) at 03/02/14 0841 Last data filed at 03/02/14 0700  Gross per 24 hour  Intake    312 ml  Output    425 ml  Net   -113 ml   Filed Weights   02/28/14 2100 03/01/14 0500 03/02/14 0500  Weight: 158 lb 9.6 oz (71.94 kg) 160 lb (72.576 kg) 154 lb 6.4 oz (70.035 kg)    PHYSICAL EXAM  General: Pleasant, NAD. Neuro: Alert and oriented X 3. Moves all extremities spontaneously. Psych: Normal affect. HEENT:  Normal  Neck: Supple without bruits or JVD. Lungs:  Resp regular and unlabored, CTA. Heart: RRR no s3, s4, or murmurs. Abdomen: Soft, non-tender, non-distended, BS + x 4.  Extremities: No clubbing, cyanosis or edema. DP/PT/Radials 2+ and equal bilaterally.  Accessory Clinical Findings  CBC  Recent Labs  03/01/14 0236 03/02/14 0435  WBC 7.8 8.4  HGB  11.1* 11.2*  HCT 33.3* 34.2*  MCV 82.6 82.8  PLT 201 193   Basic Metabolic Panel  Recent Labs  02/28/14 1653 03/01/14 0236  NA 139 139  K 4.0 3.5  CL 102 103  CO2 31 25  GLUCOSE 99 110*  BUN 16 12  CREATININE 0.64 0.69  CALCIUM 9.7 9.3   Liver Function Tests  Recent Labs  02/28/14 1653  AST 24  ALT 17  ALKPHOS 116  BILITOT 0.8  PROT 9.1*  ALBUMIN 4.7   No results for input(s): LIPASE, AMYLASE in the last 72 hours. Cardiac Enzymes  Recent Labs  02/28/14 1653 02/28/14 2300 03/01/14 0236  CKTOTAL  --  46  --   CKMB  --  1.7  --   TROPONINI 0.08* 0.06* 0.06*    Recent Labs  03/01/14 0236  CHOL 205*  HDL 25*  LDLCALC 150*  TRIG 150*  CHOLHDL 8.2   Radiology/Studies  Dg Chest Port 1 View  02/28/2014   IMPRESSION: Mild left base atelectasis.  No edema or consolidation.   Electronically Signed   By: Bretta Bang M.D.   On: 02/28/2014 17:17   TELE:  SR  ECHO: 03/01/14 Left ventricle: The cavity size was normal. Wall thickness was increased in a pattern of moderate LVH. Systolic function was moderately reduced. The estimated ejection fraction was in the range of 35% to 40%. Inferoseptal hypokinesis. Abnormal GLPSS at -15%, mostly inferiorly and inferoseptal. Doppler parameters are consistent with abnormal left ventricular relaxation (grade 1 diastolic dysfunction). The E/e&' ratio is >15, suggesting elevated LV filling pressure. - Aortic valve: Moderate aortic valve calcification. Moderate aortic stenosis. Trileaflet. There was no regurgitation. Mean gradient (S): 19 mm Hg. Peak gradient (S): 32 mm Hg. Valve area (VTI): 1.1 cm^2. Valve area (Vmax): 1.16 cm^2. Valve area (Vmean): 1.17 cm^2. - Mitral valve: Mildly thickened leaflets . There was mild regurgitation. - Left atrium: Severely dilated at 56 ml/m2. - Right atrium: The atrium was mildly dilated. - Tricuspid valve: There was no significant regurgitation. - Inferior  vena cava: The vessel was normal in size. The respirophasic diameter changes were in the normal range (>= 50%), consistent with normal central venous pressure.   ASSESSMENT AND PLAN  1. NSTEMI: Currently CP free. ECG shows an age indeterminate inferior infarct. It is very unclear what happened with Dylan Malone's prior MI's in [place], as he is somewhat of a poor historian and his daughter does not know the details. Likely, this acute event occurred at some point over the last 48 hours. Troponin is downtrending. His NSTEMI could also be 2/2 hypertensive emergency as he was quite hypertensive on presentation. - ASA 81mg  daily, atorvastatin 80 mg, enalapril 20 BID - start carvedilol 3.125 mg po BID - [facility]  2. Moderate aortic stenosis: He has no HF symptoms  3. Hyperlipidemia - LDL 150, TAG 150, low HDL, continue high dose of atorvastatin, repeat in te outpatient settings, if still elevated he might be a candidate for PCSK 9 inhibitors  4. Hypertension - add carvedilol 3.125 po BID, uptitrate as needed  4. Hemiplegia: Patient requests PT/OT to help improve mobility  # Code: Full  Signed, Lars MassonNELSON, Theona Muhs H MD, [facility] 03/02/2014

## 2014-03-02 NOTE — Progress Notes (Signed)
Physical Therapy Evaluation Patient Details Name: Dylan SpanielJose Reposa MRN: [id number] DOB: 06-Dec-1945 Today's Date: 03/02/2014   History of Present Illness  Patient is a 69 yo male admitted 02/28/14 with chest/back pain.  Patient with HTN and NSTEMI.  PMH:  CAD, MI, PVD, RLE revascularization, HTN, CVA with Lt hemiparesis, DM.  Clinical Impression  Patient presents with problems listed below.  Will benefit from acute PT to maximize functional mobility prior to discharge.  Recommend SNF at discharge for continued therapy.    Follow Up Recommendations SNF;Supervision/Assistance - 24 hour    Equipment Recommendations  None recommended by PT    Recommendations for Other Services       Precautions / Restrictions Precautions Precautions: Fall Restrictions Weight Bearing Restrictions: No      Mobility  Bed Mobility Overal bed mobility: Needs Assistance Bed Mobility: Supine to Sit;Sit to Supine     Supine to sit: Max assist Sit to supine: Mod assist   General bed mobility comments: Verbal cues to roll and move to sitting.  Patient attempting to pull trunk up to sitting.  Noted increased extension of LLE and trunk. Required max assist to move to sitting.  Once upright, patient able to maintain sitting balance with min guard assist.  Returned to supine with mod assist to control trunk and to bring LE's onto bed.  Transfers                    Ambulation/Gait                Stairs            Wheelchair Mobility    Modified Rankin (Stroke Patients Only)       Balance Overall balance assessment: Needs assistance Sitting-balance support: No upper extremity supported;Feet supported Sitting balance-Leahy Scale: Fair Sitting balance - Comments: Decreased balance with attempts at dynamic activities                                     Pertinent Vitals/Pain Pain Assessment: No/denies pain    Home Living Family/patient expects to be discharged to::  Skilled nursing facility Living Arrangements: Children (Daughter, son-in-law and 2 grandchildren)   Type of Home: House Home Access: Level entry       Home Equipment: Wheelchair - manual;Bedside commode;Shower seat      Prior Function Level of Independence: Needs assistance   Gait / Transfers Assistance Needed: Daughter has been providing max assist for OOB and transfers.  Patient unable to propel self in w/c.  ADL's / Homemaking Assistance Needed: Total assist for meal prep.  Assist for bathing/dressing.        Hand Dominance   Dominant Hand: Right    Extremity/Trunk Assessment   Upper Extremity Assessment: LUE deficits/detail       LUE Deficits / Details: No active movement noted.  Increased flexor tone.   Lower Extremity Assessment: LLE deficits/detail   LLE Deficits / Details: Strength grossly 3-/5 at knee and hip.  No DF noted.  Increased extensor tone     Communication   Communication: Prefers language other than English (Speaks some [place].  Daughter translating as needed.)  Cognition Arousal/Alertness: Awake/alert Behavior During Therapy: Flat affect Overall Cognitive Status: Within Functional Limits for tasks assessed                      General Comments  Exercises        Assessment/Plan    PT Assessment Patient needs continued PT services  PT Diagnosis Difficulty walking;Hemiplegia non-dominant side   PT Problem List Decreased strength;Decreased range of motion;Decreased activity tolerance;Decreased balance;Decreased mobility;Decreased coordination;Decreased knowledge of use of DME;Impaired tone  PT Treatment Interventions DME instruction;Functional mobility training;Therapeutic activities;Balance training;Patient/family education;Wheelchair mobility training   PT Goals (Current goals can be found in the Care Plan section) Acute Rehab PT Goals Patient Stated Goal: To have more therapy PT Goal Formulation: With patient/family Time  For Goal Achievement: 03/16/14 Potential to Achieve Goals: Fair    Frequency Min 3X/week   Barriers to discharge Decreased caregiver support (Daughter unable to provide level of assist needed.) Daughter reports unable to provide level of assist patient needs.  She had begun search/process for SNF pta.    Co-evaluation               End of Session   Activity Tolerance: Patient tolerated treatment well Patient left: in bed;with call bell/phone within reach;with family/visitor present Nurse Communication: Mobility status         Time: 1610-9604 PT Time Calculation (min) (ACUTE ONLY): 16 min   Charges:   PT Evaluation $Initial PT Evaluation Tier I: 1 Procedure PT Treatments $Therapeutic Activity: 8-22 mins   PT G Codes:        Vena Austria 03/18/2014, 7:37 PM Durenda Hurt. Renaldo Fiddler, [facility] Acute Rehab Services Pager [phone]

## 2014-03-02 NOTE — Progress Notes (Signed)
ANTICOAGULATION CONSULT NOTE - Follow Up Consult  Pharmacy Consult for Heparin Indication: NSTEMI  No Known Allergies  Patient Measurements: Height: 5\' 8"  (172.7 cm) Weight: 154 lb 6.4 oz (70.035 kg) IBW/kg (Calculated) : 68.4  Vital Signs: Temp: 98 F (36.7 C) (01/10 0500) BP: 161/64 mmHg (01/10 0500) Pulse Rate: 89 (01/10 0500)  Labs:  Recent Labs  02/28/14 1653 02/28/14 2300  03/01/14 0236 03/01/14 1136 03/01/14 1731 03/02/14 0435  HGB 13.0  --   --  11.1*  --   --  11.2*  HCT 39.5  --   --  33.3*  --   --  34.2*  PLT 223  --   --  201  --   --  193  HEPARINUNFRC  --   --   < >  --  0.50 0.40 0.41  CREATININE 0.64  --   --  0.69  --   --   --   CKTOTAL  --  46  --   --   --   --   --   CKMB  --  1.7  --   --   --   --   --   TROPONINI 0.08* 0.06*  --  0.06*  --   --   --   < > = values in this interval not displayed.  Estimated Creatinine Clearance: 83.1 mL/min (by C-G formula based on Cr of 0.69).   Medications:  Infusions:  . heparin 1,100 Units/hr (03/01/14 1244)    Assessment: 69 year old male who continues on anticoagulation with heparin for NSTEMI.  His heparin level remains therapeutic.  His CBC is stable with no bleeding noted.  Goal of Therapy:  Heparin level 0.3-0.7 units/ml Monitor platelets by anticoagulation protocol: Yes   Plan:  Continue Heparin at 1100 units/hr Daily heparin level and CBC  Estella HuskMichelle Kyran Connaughton, Pharm.D., BCPS, AAHIVP Clinical Pharmacist Phone: [phone] or [phone] 03/02/2014, 9:51 AM

## 2014-03-03 ENCOUNTER — Encounter (HOSPITAL_COMMUNITY): Payer: Self-pay | Admitting: Cardiovascular Disease

## 2014-03-03 ENCOUNTER — Encounter (HOSPITAL_COMMUNITY)
Admission: EM | Disposition: A | Discharge status: Skilled Nursing Facility | Payer: Self-pay | Source: Home / Self Care | Attending: Cardiology

## 2014-03-03 DIAGNOSIS — I25110 Atherosclerotic heart disease of native coronary artery with unstable angina pectoris: Secondary | ICD-10-CM

## 2014-03-03 HISTORY — PX: LEFT HEART CATHETERIZATION WITH CORONARY ANGIOGRAM: SHX5451

## 2014-03-03 LAB — POCT ACTIVATED CLOTTING TIME
ACTIVATED CLOTTING TIME: 190 s
Activated Clotting Time: 140 seconds

## 2014-03-03 LAB — PROTIME-INR
INR: 1.01 (ref 0.00–1.49)
PROTHROMBIN TIME: 13.4 s (ref 11.6–15.2)

## 2014-03-03 LAB — HEPARIN LEVEL (UNFRACTIONATED): Heparin Unfractionated: 0.34 IU/mL (ref 0.30–0.70)

## 2014-03-03 LAB — CBC
HEMATOCRIT: 37.9 % — AB (ref 39.0–52.0)
Hemoglobin: 12.5 g/dL — ABNORMAL LOW (ref 13.0–17.0)
MCH: 28.3 pg (ref 26.0–34.0)
MCHC: 33 g/dL (ref 30.0–36.0)
MCV: 85.7 fL (ref 78.0–100.0)
PLATELETS: 245 10*3/uL (ref 150–400)
RBC: 4.42 MIL/uL (ref 4.22–5.81)
RDW: 15.9 % — ABNORMAL HIGH (ref 11.5–15.5)
WBC: 10.1 10*3/uL (ref 4.0–10.5)

## 2014-03-03 SURGERY — LEFT HEART CATHETERIZATION WITH CORONARY ANGIOGRAM
Anesthesia: LOCAL

## 2014-03-03 MED ORDER — LIDOCAINE HCL (PF) 1 % IJ SOLN
INTRAMUSCULAR | Status: AC
  Filled 2014-03-03: qty 30

## 2014-03-03 MED ORDER — CARVEDILOL 6.25 MG PO TABS
6.2500 mg | ORAL_TABLET | Freq: Two times a day (BID) | ORAL | Status: DC
  Administered 2014-03-03 – 2014-03-05 (×5): 6.25 mg via ORAL
  Filled 2014-03-03 (×5): qty 1
  Filled 2014-03-03: qty 2
  Filled 2014-03-03 (×2): qty 1

## 2014-03-03 MED ORDER — CLOPIDOGREL BISULFATE 75 MG PO TABS
600.0000 mg | ORAL_TABLET | Freq: Once | ORAL | Status: AC
  Administered 2014-03-03: 600 mg via ORAL
  Filled 2014-03-03: qty 8

## 2014-03-03 MED ORDER — CLOPIDOGREL BISULFATE 75 MG PO TABS
75.0000 mg | ORAL_TABLET | Freq: Every day | ORAL | Status: DC
  Administered 2014-03-04 – 2014-03-05 (×2): 75 mg via ORAL
  Filled 2014-03-03 (×2): qty 1

## 2014-03-03 MED ORDER — MIDAZOLAM HCL 2 MG/2ML IJ SOLN
INTRAMUSCULAR | Status: AC
  Filled 2014-03-03: qty 2

## 2014-03-03 MED ORDER — HYDRALAZINE HCL 20 MG/ML IJ SOLN
10.0000 mg | Freq: Four times a day (QID) | INTRAMUSCULAR | Status: DC | PRN
  Administered 2014-03-03: 18:00:00 10 mg via INTRAVENOUS
  Filled 2014-03-03 (×2): qty 1

## 2014-03-03 MED ORDER — SODIUM CHLORIDE 0.9 % IV SOLN
INTRAVENOUS | Status: AC
  Administered 2014-03-03: 17:00:00 via INTRAVENOUS

## 2014-03-03 MED ORDER — NITROGLYCERIN 1 MG/10 ML FOR IR/CATH LAB
INTRA_ARTERIAL | Status: AC
  Filled 2014-03-03: qty 10

## 2014-03-03 MED ORDER — FENTANYL CITRATE 0.05 MG/ML IJ SOLN
INTRAMUSCULAR | Status: AC
  Filled 2014-03-03: qty 2

## 2014-03-03 MED ORDER — HEPARIN (PORCINE) IN NACL 2-0.9 UNIT/ML-% IJ SOLN
INTRAMUSCULAR | Status: AC
  Filled 2014-03-03: qty 1500

## 2014-03-03 MED ORDER — HEPARIN SODIUM (PORCINE) 1000 UNIT/ML IJ SOLN
INTRAMUSCULAR | Status: AC
  Filled 2014-03-03: qty 1

## 2014-03-03 MED ORDER — STARCH (THICKENING) PO POWD
ORAL | Status: DC | PRN
  Filled 2014-03-03: qty 227

## 2014-03-03 MED ORDER — HYDRALAZINE HCL 20 MG/ML IJ SOLN
10.0000 mg | Freq: Once | INTRAMUSCULAR | Status: AC
  Administered 2014-03-03: 10 mg via INTRAVENOUS

## 2014-03-03 NOTE — Interval H&P Note (Signed)
History and Physical Interval Note:  03/03/2014 2:21 PM  Dylan Malone  has presented today for cardiac cath with the diagnosis of NSTEMI. The various methods of treatment have been discussed with the patient and family. After consideration of risks, benefits and other options for treatment, the patient has consented to  Procedure(s): LEFT HEART CATHETERIZATION WITH CORONARY ANGIOGRAM (N/A) as a surgical intervention .  The patient's history has been reviewed, patient examined, no change in status, stable for surgery.  I have reviewed the patient's chart and labs.  Questions were answered to the patient's satisfaction.    Cath Lab Visit (complete for each Cath Lab visit)  Clinical Evaluation Leading to the Procedure:   ACS: Yes.    Non-ACS:    Anginal Classification: CCS III  Anti-ischemic medical therapy: Minimal Therapy (1 class of medications)  Non-Invasive Test Results: No non-invasive testing performed  Prior CABG: No previous CABG         MCALHANY,CHRISTOPHER

## 2014-03-03 NOTE — CV Procedure (Signed)
Cardiac Catheterization Operative Report  Dylan Malone [id number] 1/11/20162:32 PM Delbert HarnessBRISCOE, KIM, MD  Procedure Performed:  1. Left Heart Catheterization 2. Selective Coronary Angiography 3. Left ventricular angiogram  Operator: Verne Carrowhristopher Cale Decarolis, MD  Arterial access site:  Right radial artery and right femoral artery.  Indication:  69 yo [place] male with history of CAD, PAD, CVA, DM, HTN admitted with chest pain, NSTEMI.                                  Procedure Details: The risks, benefits, complications, treatment options, and expected outcomes were discussed with the patient. The patient and/or family concurred with the proposed plan, giving informed consent. The patient was brought to the cath lab after IV hydration was begun and oral premedication was given. The patient was sedated with Versed and Fentanyl. The right wrist was assessed with a modified Allens test which was positive. The right wrist was prepped and draped in a sterile fashion. 1% lidocaine was used for local anesthesia. Using the modified Seldinger access technique, a 5 French sheath was placed in the right radial artery. 3 mg Verapamil was given through the sheath. 3500 units IV heparin was given. I was able to engage the RCA with a JR4 catheter but due to tortuosity in the innominate artery I could not torque the left sided catheters into the left main artery. After failing to engage the left main with multiple different catheters, I elected to gain access in the right femoral artery. The right groin was prepped and draped a sterile fashion. I then placed a 5 French sheath in the right femoral artery. I then used a JL4 catheter to engage the left main artery. Selective angiography was performed. A pigtail catheter was used to perform a left ventricular angiogram. The sheath was removed from the right radial artery and a Terumo hemostasis band was applied at the arteriotomy site on the right wrist.   There  were no immediate complications. The patient was taken to the recovery area in stable condition.   Hemodynamic Findings: Central aortic pressure: 138/51 Left ventricular pressure: 153/2/10  Angiographic Findings:  Left main: No obstructive disease.  Left Anterior Descending Artery: Large caliber vessel that courses to the apex. The proximal vessel has diffuse 40% stenosis. The mid vessel has diffuse 50% stenosis followed by a focal 99% stenosis. The distal vessel has a focal 50% stenosis. There are two moderate caliber diagonal vessels with non-obstructive plaque.   Circumflex Artery: Large caliber vessel with large caliber obtuse marginal branch. Mild plaque in the obtuse marginal branch.   Right Coronary Artery: Large dominant vessel with 40% proximal stenosis. The mid vessel has serial 90% stenoses. The distal vessel is diffusely diseased with 70% stenosis just before the bifurcation into the small PDA and the diffusely diseased small posterolateral artery.   Left Ventricular Angiogram: LVEF=35-40% with inferior wall hypokinesis.   Impression: 1. Severe double vessel CAD 2. Unstable angina (class 4), NSTEMI 3. Moderate LV systolic dysfunction  Recommendations: He will need PCI of the mid LAD and the mid RCA. I do not think he is a candidate for CABG given his inability to walk following his CVA. He cannot ambulate and is confined to a wheelchair. He would not be able to rehab following CABG. He does not swallow thin liquids following his CVA. I do not feel that I can safely load him with Plavix  on the cath table today. Will crush Plavix 600 mg and give it in apple sauce tonight. Will hydrate post cath and will plan PCI of the LAD and RCA tomorrow. NPO at midnight tonight.        Complications:  None. The patient tolerated the procedure well.

## 2014-03-03 NOTE — Progress Notes (Signed)
Patient oriented to the unit and plan of care with assistance of interpretor.

## 2014-03-03 NOTE — Clinical Social Work Placement (Addendum)
    Clinical Social Work Department CLINICAL SOCIAL WORK PLACEMENT NOTE 03/03/2014  Patient:  Dylan Malone,Dylan Malone  Account Number:  [id number] Admit date:  02/28/2014  Clinical Social Worker:  Dylan Malone, Theresia MajorsLCSWA  Date/time:  03/03/2014 03:13 PM  Clinical Social Work is seeking post-discharge placement for this patient at the following level of care:   SKILLED NURSING   (*CSW will update this form in Epic as items are completed)   03/03/2014  Patient/family provided with [facility] Department of Clinical Social Work's list of facilities offering this level of care within the geographic area requested by the patient (or if unable, by the patient's family).  03/03/2014  Patient/family informed of their freedom to choose among providers that offer the needed level of care, that participate in Medicare, Medicaid or managed care program needed by the patient, have an available bed and are willing to accept the patient.  03/03/2014  Patient/family informed of [facility]' ownership interest in [facility], as well as of the fact that they are under no obligation to receive care at this facility.  PASARR submitted to EDS on 03/03/2014 PASARR number received on 03/03/2014  FL2 transmitted to all facilities in geographic area requested by pt/family on  03/03/2014 FL2 transmitted to all facilities within larger geographic area on   Patient informed that his/her managed care company has contracts with or will negotiate with  certain facilities, including the following:     Patient/family informed of bed offers received:   [facility] Physician recommends and patient chooses bed at    [facility]   Patient to be transferred to facility by ambulance Patient and family notified of transfer on 03/05/14 Name of family member notified: Daughter, Sherlon HandingJomayra Friedel at the bedside on 03/05/14  The following  physician request were entered in Epic: Physician Request  Please sign FL2.    Additional Comments:  03/05/14 - Genelle BalVanessa Bayley [place], MSW, LCSW Licensed Clinical Social Worker Clinical Social Work Department [facility] - [phone] Dylan Harnessoonum Malone, [place]CSWA [phone]

## 2014-03-03 NOTE — H&P (View-Only) (Signed)
 SUBJECTIVE:  No compliants.  Denies any CP  OBJECTIVE:   Vitals:   Filed Vitals:   03/02/14 1356 03/02/14 2150 03/03/14 0450 03/03/14 0631  BP: 113/63 140/50 147/53 147/53  Pulse: 83 73 65 65  Temp: 97.9 F (36.6 C) 98.3 F (36.8 C)  97.7 F (36.5 C)  TempSrc: Oral Oral    Resp: 18 20  18  Height:      Weight:    160 lb (72.576 kg)  SpO2: 98% 96% 96% 96%   I&O's:   Intake/Output Summary (Last 24 hours) at 03/03/14 0838 Last data filed at 03/02/14 2100  Gross per 24 hour  Intake    360 ml  Output    301 ml  Net     59 ml   TELEMETRY: Reviewed telemetry pt in NSR:     PHYSICAL EXAM General: Well developed, well nourished, in no acute distress Head: Eyes PERRLA, No xanthomas.   Normal cephalic and atramatic  Lungs:   Clear bilaterally to auscultation and percussion. Heart:   HRRR S1 S2 Pulses are 2+ & equal. Abdomen: Bowel sounds are positive, abdomen soft and non-tender without masses Extremities:   No clubbing, cyanosis or edema.  DP +1 Neuro: Alert and oriented X 3. Psych:  Good affect, responds appropriately   LABS: Basic Metabolic Panel:  Recent Labs  02/28/14 1653 03/01/14 0236  NA 139 139  K 4.0 3.5  CL 102 103  CO2 31 25  GLUCOSE 99 110*  BUN 16 12  CREATININE 0.64 0.69  CALCIUM 9.7 9.3   Liver Function Tests:  Recent Labs  02/28/14 1653  AST 24  ALT 17  ALKPHOS 116  BILITOT 0.8  PROT 9.1*  ALBUMIN 4.7   No results for input(s): LIPASE, AMYLASE in the last 72 hours. CBC:  Recent Labs  03/02/14 0435 03/03/14 0418  WBC 8.4 10.1  HGB 11.2* 12.5*  HCT 34.2* 37.9*  MCV 82.8 85.7  PLT 193 245   Cardiac Enzymes:  Recent Labs  02/28/14 1653 02/28/14 2300 03/01/14 0236  CKTOTAL  --  46  --   CKMB  --  1.7  --   TROPONINI 0.08* 0.06* 0.06*   BNP: Invalid input(s): POCBNP D-Dimer: No results for input(s): DDIMER in the last 72 hours. Hemoglobin A1C:  Recent Labs  02/28/14 2300  HGBA1C 5.4   Fasting Lipid  Panel:  Recent Labs  03/01/14 0236  CHOL 205*  HDL 25*  LDLCALC 150*  TRIG 150*  CHOLHDL 8.2   Thyroid Function Tests: No results for input(s): TSH, T4TOTAL, T3FREE, THYROIDAB in the last 72 hours.  Invalid input(s): FREET3 Anemia Panel: No results for input(s): VITAMINB12, FOLATE, FERRITIN, TIBC, IRON, RETICCTPCT in the last 72 hours. Coag Panel:   Lab Results  Component Value Date   INR 1.01 03/03/2014    RADIOLOGY: Dg Chest Port 1 View  02/28/2014   CLINICAL DATA:  Chest pain and difficulty breathing for 1 day  EXAM: PORTABLE CHEST - 1 VIEW  COMPARISON:  None.  FINDINGS: There is mild left base atelectasis. Elsewhere lungs are clear. Heart is upper normal in size with pulmonary vascularity within normal limits. No adenopathy. There is atherosclerotic change in the aortic arch region. There is degenerative change in the thoracic spine.  IMPRESSION: Mild left base atelectasis.  No edema or consolidation.   Electronically Signed   By: William  Woodruff M.D.   On: 02/28/2014 17:17    ASSESSMENT AND PLAN  1. NSTEMI:   Currently CP free. ECG shows an age indeterminate inferior infarct. It is very unclear what happened with Dylan Malone's prior MI's in [place], as he is somewhat of a poor historian and his daughter does not know the details. Likely, this acute event occurred at some point over the last 48 hours. Troponin is downtrending. His NSTEMI could also be 2/2 hypertensive emergency as he was quite hypertensive on presentation. - ASA 81mg daily, atorvastatin 80 mg, enalapril 20 BID, increase coreg to 6.25mg BID - plan for LHC today  2. Moderate aortic stenosis: He has no HF symptoms  3. Hyperlipidemia - LDL 150, TAG 150, low HDL, continue high dose of atorvastatin, repeat in the outpatient settings, if still elevated he might be a candidate for PCSK 9 inhibitors  4. Hypertension - Increase Coreg to 6.25mg BID for better control  5.  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 Secondary  to CAD vs. HTN - continue ACE I and BB    TURNER,TRACI R, MD  03/03/2014  8:38 AM  

## 2014-03-03 NOTE — Progress Notes (Signed)
OT cancellation    03/03/14 1412  OT Visit Information  Last OT Received On 03/03/14  Reason Eval/Treat Not Completed Patient at procedure or test/ unavailable   Jenell MillinerLindsey Criag Wicklund, OTR/L [phone]

## 2014-03-03 NOTE — Clinical Social Work Psychosocial (Signed)
     Clinical Social Work Department BRIEF PSYCHOSOCIAL ASSESSMENT 03/03/2014  Patient:  Dylan Malone,Dylan Malone     Account Number:  [id number]     Admit date:  02/28/2014  Clinical Social Worker:  Harless NakayamaAMBELAL,Alexica Schlossberg, LCSWA  Date/Time:  03/03/2014 02:50 PM  Referred by:  Physician  Date Referred:  03/03/2014 Referred for  SNF Placement   Other Referral:   Interview type:  Family Other interview type:    PSYCHOSOCIAL DATA Living Status:  WITH ADULT CHILDREN Admitted from facility:   Level of care:   Primary support name:  [name] Primary support relationship to patient:  CHILD, ADULT Degree of support available:    CURRENT CONCERNS Current Concerns  Post-Acute Placement   Other Concerns:    SOCIAL WORK ASSESSMENT / PLAN CSW aware that pt is off unit for procedure. CSW called pt daughter and discussed SNF placement. Pt daughter informed CSW that pt home health agency was working on find placement for pt at home. Pt daughter was just trying to get an FL2 so pt could be [facility] to [facility]. CSW notified pt daughter that she no longer needs to worry about this and explained CSW role. Pt daughter expressed relief that CSW will now be assisting with this. Pt daughter explained how this process has not been easy and she is glad to have help.    CSW spoke with [facility] representative. CSW awaiting call back to confirm they will be able to admit pt when pt is medically stable.   Assessment/plan status:  Psychosocial Support/Ongoing Assessment of Needs Other assessment/ plan:   Information/referral to community resources:   SNF list not needed    PATIENTS/FAMILYS RESPONSE TO PLAN OF CARE: Pt daughter pleasant and coopeartive. Pt daughter relieved to have assistance with SNF process.    Krysta Bloomfield, LCSWA  [phone]

## 2014-03-03 NOTE — Progress Notes (Signed)
SUBJECTIVE:  No compliants.  Denies any CP  OBJECTIVE:   Vitals:   Filed Vitals:   03/02/14 1356 03/02/14 2150 03/03/14 0450 03/03/14 0631  BP: 113/63 140/50 147/53 147/53  Pulse: 83 73 65 65  Temp: 97.9 F (36.6 C) 98.3 F (36.8 C)  97.7 F (36.5 C)  TempSrc: Oral Oral    Resp: Height:      Weight:    160 lb (72.576 kg)  SpO2: 98% 96% 96% 96%   I&O's:   Intake/Output Summary (Last 24 hours) at 03/03/14 1610 Last data filed at 03/02/14 2100  Gross per 24 hour  Intake    360 ml  Output    301 ml  Net     59 ml   TELEMETRY: Reviewed telemetry pt in NSR:     PHYSICAL EXAM General: Well developed, well nourished, in no acute distress Head: Eyes PERRLA, No xanthomas.   Normal cephalic and atramatic  Lungs:   Clear bilaterally to auscultation and percussion. Heart:   HRRR S1 S2 Pulses are 2+ & equal. Abdomen: Bowel sounds are positive, abdomen soft and non-tender without masses Extremities:   No clubbing, cyanosis or edema.  DP +1 Neuro: Alert and oriented X 3. Psych:  Good affect, responds appropriately   LABS: Basic Metabolic Panel:  Recent Labs  96/04/54 1653 03/01/14 0236  NA 139 139  K 4.0 3.5  CL 102 103  CO2 31 25  GLUCOSE 99 110*  BUN 16 12  CREATININE 0.64 0.69  CALCIUM 9.7 9.3   Liver Function Tests:  Recent Labs  02/28/14 1653  AST 24  ALT 17  ALKPHOS 116  BILITOT 0.8  PROT 9.1*  ALBUMIN 4.7   No results for input(s): LIPASE, AMYLASE in the last 72 hours. CBC:  Recent Labs  03/02/14 0435 03/03/14 0418  WBC 8.4 10.1  HGB 11.2* 12.5*  HCT 34.2* 37.9*  MCV 82.8 85.7  PLT 193 245   Cardiac Enzymes:  Recent Labs  02/28/14 1653 02/28/14 2300 03/01/14 0236  CKTOTAL  --  46  --   CKMB  --  1.7  --   TROPONINI 0.08* 0.06* 0.06*   BNP: Invalid input(s): POCBNP D-Dimer: No results for input(s): DDIMER in the last 72 hours. Hemoglobin A1C:  Recent Labs  02/28/14 2300  HGBA1C 5.4   Fasting Lipid  Panel:  Recent Labs  03/01/14 0236  CHOL 205*  HDL 25*  LDLCALC 150*  TRIG 150*  CHOLHDL 8.2   Thyroid Function Tests: No results for input(s): TSH, T4TOTAL, T3FREE, THYROIDAB in the last 72 hours.  Invalid input(s): FREET3 Anemia Panel: No results for input(s): VITAMINB12, FOLATE, FERRITIN, TIBC, IRON, RETICCTPCT in the last 72 hours. Coag Panel:   Lab Results  Component Value Date   INR 1.01 03/03/2014    RADIOLOGY: Dg Chest Port 1 View  02/28/2014   CLINICAL DATA:  Chest pain and difficulty breathing for 1 day  EXAM: PORTABLE CHEST - 1 VIEW  COMPARISON:  None.  FINDINGS: There is mild left base atelectasis. Elsewhere lungs are clear. Heart is upper normal in size with pulmonary vascularity within normal limits. No adenopathy. There is atherosclerotic change in the aortic arch region. There is degenerative change in the thoracic spine.  IMPRESSION: Mild left base atelectasis.  No edema or consolidation.   Electronically Signed   By: Bretta Bang M.D.   On: 02/28/2014 17:17    ASSESSMENT AND PLAN  1. NSTEMI:  Currently CP free. ECG shows an age indeterminate inferior infarct. It is very unclear what happened with Mr. Mariea ClontsBlasini's prior MI's in [place], as he is somewhat of a poor historian and his daughter does not know the details. Likely, this acute event occurred at some point over the last 48 hours. Troponin is downtrending. His NSTEMI could also be 2/2 hypertensive emergency as he was quite hypertensive on presentation. - ASA 81mg  daily, atorvastatin 80 mg, enalapril 20 BID, increase coreg to 6.25mg  BID - plan for LHC today  2. Moderate aortic stenosis: He has no HF symptoms  3. Hyperlipidemia - LDL 150, TAG 150, low HDL, continue high dose of atorvastatin, repeat in the outpatient settings, if still elevated he might be a candidate for PCSK 9 inhibitors  4. Hypertension - Increase Coreg to 6.25mg  BID for better control  5.  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 Secondary  to CAD vs. HTN - continue ACE I and BB    Quintella ReichertURNER,Anwyn Kriegel R, MD  03/03/2014  8:38 AM

## 2014-03-03 NOTE — Progress Notes (Signed)
ANTICOAGULATION CONSULT NOTE - Follow Up Consult  Pharmacy Consult for Heparin Indication: chest pain/ACS  No Known Allergies  Patient Measurements: Height: 5\' 8"  (172.7 cm) Weight: 160 lb (72.576 kg) IBW/kg (Calculated) : 68.4 Heparin Dosing Weight:   Vital Signs: Temp: 97.7 F (36.5 C) (01/11 0631) BP: 165/55 mmHg (01/11 0853) Pulse Rate: 65 (01/11 0631)  Labs:  Recent Labs  02/28/14 1653 02/28/14 2300  03/01/14 0236  03/01/14 1731 03/02/14 0435 03/03/14 0418  HGB 13.0  --   --  11.1*  --   --  11.2* 12.5*  HCT 39.5  --   --  33.3*  --   --  34.2* 37.9*  PLT 223  --   --  201  --   --  193 245  LABPROT  --   --   --   --   --   --   --  13.4  INR  --   --   --   --   --   --   --  1.01  HEPARINUNFRC  --   --   < >  --   < > 0.40 0.41 0.34  CREATININE 0.64  --   --  0.69  --   --   --   --   CKTOTAL  --  46  --   --   --   --   --   --   CKMB  --  1.7  --   --   --   --   --   --   TROPONINI 0.08* 0.06*  --  0.06*  --   --   --   --   < > = values in this interval not displayed.  Estimated Creatinine Clearance: 83.1 mL/min (by C-G formula based on Cr of 0.69).   Medications:  Scheduled:  . allopurinol  300 mg Oral Daily  . aspirin EC  81 mg Oral Daily  . atorvastatin  80 mg Oral q1800  . carvedilol  6.25 mg Oral BID WC  . Chlorhexidine Gluconate Cloth  6 each Topical Q0600  . clopidogrel  75 mg Oral Q breakfast  . enalapril  20 mg Oral BID  . hydrALAZINE  12.5 mg Oral 3 times per day  . isosorbide mononitrate  30 mg Oral Daily  . mupirocin ointment  1 application Nasal BID  . sertraline  100 mg Oral Daily  . sodium chloride  3 mL Intravenous Q12H    Assessment: [age]yo male with NSTEMI, for cath today.  Heparin level 0.34 on 1100 units/hr, within goal range.  Hg 12.5 and pltc wnl.  No bleeding noted.  Goal of Therapy:  Heparin level 0.3-0.7 units/ml Monitor platelets by anticoagulation protocol: Yes   Plan:  Continue heparin at current rate F/U after  cath  Marisue HumbleKendra Emanuelle Hammerstrom, PharmD Clinical Pharmacist [facility]

## 2014-03-04 ENCOUNTER — Encounter (HOSPITAL_COMMUNITY): Payer: Self-pay | Admitting: Cardiology

## 2014-03-04 ENCOUNTER — Encounter (HOSPITAL_COMMUNITY)
Admission: EM | Disposition: A | Discharge status: Skilled Nursing Facility | Payer: Medicaid Other | Source: Home / Self Care | Attending: Cardiology

## 2014-03-04 DIAGNOSIS — I25110 Atherosclerotic heart disease of native coronary artery with unstable angina pectoris: Secondary | ICD-10-CM

## 2014-03-04 HISTORY — PX: PERCUTANEOUS CORONARY STENT INTERVENTION (PCI-S): SHX5485

## 2014-03-04 LAB — CBC
HCT: 33.2 % — ABNORMAL LOW (ref 39.0–52.0)
HEMATOCRIT: 34.4 % — AB (ref 39.0–52.0)
Hemoglobin: 11.2 g/dL — ABNORMAL LOW (ref 13.0–17.0)
Hemoglobin: 11.2 g/dL — ABNORMAL LOW (ref 13.0–17.0)
MCH: 27.1 pg (ref 26.0–34.0)
MCH: 28.8 pg (ref 26.0–34.0)
MCHC: 32.6 g/dL (ref 30.0–36.0)
MCHC: 33.7 g/dL (ref 30.0–36.0)
MCV: 83.1 fL (ref 78.0–100.0)
MCV: 85.3 fL (ref 78.0–100.0)
Platelets: 240 10*3/uL (ref 150–400)
Platelets: 198 10*3/uL (ref 150–400)
RBC: 4.14 MIL/uL — AB (ref 4.22–5.81)
RBC: 3.89 MIL/uL — ABNORMAL LOW (ref 4.22–5.81)
RDW: 16 % — ABNORMAL HIGH (ref 11.5–15.5)
RDW: 16.1 % — ABNORMAL HIGH (ref 11.5–15.5)
WBC: 9.8 10*3/uL (ref 4.0–10.5)
WBC: 8.7 10*3/uL (ref 4.0–10.5)

## 2014-03-04 LAB — GLUCOSE, CAPILLARY
GLUCOSE-CAPILLARY: 79 mg/dL (ref 70–99)
Glucose-Capillary: 104 mg/dL — ABNORMAL HIGH (ref 70–99)
Glucose-Capillary: 112 mg/dL — ABNORMAL HIGH (ref 70–99)

## 2014-03-04 LAB — BASIC METABOLIC PANEL
Anion gap: 9 (ref 5–15)
BUN: 12 mg/dL (ref 6–23)
CHLORIDE: 107 meq/L (ref 96–112)
CO2: 22 mmol/L (ref 19–32)
Calcium: 9.6 mg/dL (ref 8.4–10.5)
Creatinine, Ser: 0.6 mg/dL (ref 0.50–1.35)
GFR calc Af Amer: >90 mL/min (ref >90)
GFR calc non Af Amer: >90 mL/min (ref >90)
GLUCOSE: 111 mg/dL — AB (ref 70–99)
POTASSIUM: 3.6 mmol/L (ref 3.5–5.1)
Sodium: 138 mmol/L (ref 135–145)

## 2014-03-04 LAB — CREATININE, SERUM
Creatinine, Ser: 0.67 mg/dL (ref 0.50–1.35)
GFR calc Af Amer: >90 mL/min (ref >90)

## 2014-03-04 LAB — POCT ACTIVATED CLOTTING TIME: ACTIVATED CLOTTING TIME: 595 s

## 2014-03-04 SURGERY — PERCUTANEOUS CORONARY STENT INTERVENTION (PCI-S)

## 2014-03-04 MED ORDER — SODIUM CHLORIDE 0.9 % IV SOLN
1.0000 mL/kg/h | INTRAVENOUS | Status: DC
  Administered 2014-03-04: 05:00:00 1 mL/kg/h via INTRAVENOUS

## 2014-03-04 MED ORDER — ASPIRIN 81 MG PO CHEW
81.0000 mg | CHEWABLE_TABLET | ORAL | Status: AC
  Administered 2014-03-04: 81 mg via ORAL
  Filled 2014-03-04: qty 1

## 2014-03-04 MED ORDER — SODIUM CHLORIDE 0.9 % IV SOLN: 250.0000 mL | INTRAVENOUS | Status: DC | PRN

## 2014-03-04 MED ORDER — SODIUM CHLORIDE 0.9 % IJ SOLN: 3.0000 mL | Freq: Two times a day (BID) | INTRAMUSCULAR | Status: DC

## 2014-03-04 MED ORDER — NITROGLYCERIN 1 MG/10 ML FOR IR/CATH LAB
INTRA_ARTERIAL | Status: AC
  Filled 2014-03-04: qty 10

## 2014-03-04 MED ORDER — MIDAZOLAM HCL 2 MG/2ML IJ SOLN
INTRAMUSCULAR | Status: AC
  Filled 2014-03-04: qty 2

## 2014-03-04 MED ORDER — LABETALOL HCL 5 MG/ML IV SOLN
INTRAVENOUS | Status: AC
  Filled 2014-03-04: qty 4

## 2014-03-04 MED ORDER — HYDRALAZINE HCL 20 MG/ML IJ SOLN: 20.0000 mg | INTRAMUSCULAR | Status: DC | PRN

## 2014-03-04 MED ORDER — FENTANYL CITRATE 0.05 MG/ML IJ SOLN
INTRAMUSCULAR | Status: AC
  Filled 2014-03-04: qty 2

## 2014-03-04 MED ORDER — SODIUM CHLORIDE 0.9 % IV SOLN
1.0000 mL/kg/h | INTRAVENOUS | Status: AC
  Administered 2014-03-04: 10:00:00 1 mL/kg/h via INTRAVENOUS

## 2014-03-04 MED ORDER — SODIUM CHLORIDE 0.9 % IJ SOLN: 3.0000 mL | INTRAMUSCULAR | Status: DC | PRN

## 2014-03-04 MED ORDER — HEPARIN SODIUM (PORCINE) 5000 UNIT/ML IJ SOLN
5000.0000 [IU] | Freq: Three times a day (TID) | INTRAMUSCULAR | Status: DC
  Administered 2014-03-04 – 2014-03-05 (×2): 5000 [IU] via SUBCUTANEOUS
  Filled 2014-03-04 (×5): qty 1

## 2014-03-04 MED ORDER — LIDOCAINE HCL (PF) 1 % IJ SOLN
INTRAMUSCULAR | Status: AC
  Filled 2014-03-04: qty 30

## 2014-03-04 MED ORDER — HEPARIN (PORCINE) IN NACL 2-0.9 UNIT/ML-% IJ SOLN
INTRAMUSCULAR | Status: AC
  Filled 2014-03-04: qty 1500

## 2014-03-04 MED ORDER — MORPHINE SULFATE 2 MG/ML IJ SOLN: 2.0000 mg | INTRAMUSCULAR | Status: DC | PRN

## 2014-03-04 MED ORDER — SODIUM CHLORIDE 0.9 % IV SOLN
1.7500 mg/kg/h | INTRAVENOUS | Status: DC
  Filled 2014-03-04: qty 250

## 2014-03-04 MED ORDER — BIVALIRUDIN 250 MG IV SOLR
INTRAVENOUS | Status: AC
  Filled 2014-03-04: qty 250

## 2014-03-04 NOTE — Progress Notes (Signed)
Physical Therapy Treatment Patient Details Name: Dylan Malone MRN: [id number] DOB: 10/11/1945 Today's Date: 03/04/2014    History of Present Illness Patient is a 69 yo male admitted 02/28/14 with chest/back pain.  Patient with HTN and NSTEMI.  PMH:  CAD, MI, PVD, RLE revascularization, HTN, CVA with Lt hemiparesis, DM.    PT Comments    Focused on bed mobility today, limited by femoral sheath which RN reports is going to be removed shortly. Patient will continue to benefit from skilled physical therapy services to further improve independence with functional mobility. Continue to recommend SNF at d/c.    Follow Up Recommendations  SNF;Supervision/Assistance - 24 hour     Equipment Recommendations  None recommended by PT    Recommendations for Other Services       Precautions / Restrictions Precautions Precautions: Fall Restrictions Weight Bearing Restrictions: No    Mobility  Bed Mobility Overal bed mobility: Needs Assistance Bed Mobility: Supine to Sit;Sit to Supine     Supine to sit: Min assist;HOB elevated Sit to supine: Mod assist;HOB elevated;+2 for physical assistance   General bed mobility comments: Min assist for trunk support with heavy use of rail to rise. Attempting to bring pt to edge of bed when RN entered and request pt be placed in bed for femoral sheath removal. Required Mod assist for LE support and trunk support +2 to return to bed. VC provided throughout for technique.  Transfers                    Ambulation/Gait                 Stairs            Wheelchair Mobility    Modified Rankin (Stroke Patients Only)       Balance                                    Cognition Arousal/Alertness: Awake/alert Behavior During Therapy: Flat affect Overall Cognitive Status: Within Functional Limits for tasks assessed                      Exercises      General Comments        Pertinent Vitals/Pain Pain  Assessment: No/denies pain  SpO2 97% on room air HR 79 BP 148/35    Home Living                      Prior Function            PT Goals (current goals can now be found in the care plan section) Acute Rehab PT Goals PT Goal Formulation: With patient/family Time For Goal Achievement: 03/16/14 Potential to Achieve Goals: Fair Progress towards PT goals: Progressing toward goals    Frequency  Min 3X/week    PT Plan Current plan remains appropriate    Co-evaluation             End of Session   Activity Tolerance: Treatment limited secondary to medical complications (Comment) (RN to remove femoral cath.) Patient left: with call bell/phone within reach;in bed;with nursing/sitter in room (RN assessing pt.)     Time: 1353-1403 PT Time Calculation (min) (ACUTE ONLY): 10 min  Charges:  $Therapeutic Activity: 8-22 mins  G Codes:      Berton Mount 03/26/2014, 2:58 PM Sunday Sp[place], [place] [phone]

## 2014-03-04 NOTE — Progress Notes (Signed)
TR BAND REMOVAL  LOCATION:    right radial  DEFLATED PER PROTOCOL:    Yes.    TIME BAND OFF / DRESSING APPLIED:    21:00   SITE UPON ARRIVAL:    Level 0  SITE AFTER BAND REMOVAL:    Level 0  REVERSE ALLEN'S TEST:     positive  CIRCULATION SENSATION AND MOVEMENT:    Within Normal Limits   Yes.    COMMENTS:   Pt tolerated removal of tr band without complication or distress, will continue to monitor patient.

## 2014-03-04 NOTE — Care Management Note (Addendum)
  Page 1 of 1   03/04/2014     11:01:47 AM CARE MANAGEMENT NOTE 03/04/2014  Patient:  Dylan Malone,Dylan   Account Number:  [id number]  Date Initiated:  03/04/2014  Documentation initiated by:  Donato SchultzHUTCHINSON,Matias Thurman  Subjective/Objective Assessment:   CP, NSTEMI, CAD  (Spanish Speaking)     Action/Plan:   CM to follow for disposition needs   Anticipated DC Date:  03/05/2014   Anticipated DC Plan:  HOME/SELF CARE         Choice offered to / List presented to:             Status of service:  Completed, signed off Medicare Important Message given?   (If response is "NO", the following Medicare IM given date fields will be blank) Date Medicare IM given:   Medicare IM given by:   Date Additional Medicare IM given:   Additional Medicare IM given by:    Discharge Disposition:  HOME/SELF CARE  Per UR Regulation:  Reviewed for med. necessity/level of care/duration of stay  If discussed at Long Length of Stay Meetings, dates discussed:    Comments:  Dylan Uzelac RN, BSN, MSHL, CCM  Nurse - Case Manager,  (Unit [phone]  03/04/2014 TF from 3W; heart Cath; stagged PCI for 03/04/14

## 2014-03-04 NOTE — Progress Notes (Signed)
OT Cancellation Note  Patient Details Name: Dylan Malone MRN: [id number] DOB: [birth year]   Cancelled Treatment:    Reason Eval/Treat Not Completed: Patient not medically ready. According to RN, patient on bed rest until 8pm. Will follow-up as able/time allows.   Nomi Rudnicki 03/04/2014, 3:44 PM

## 2014-03-04 NOTE — Progress Notes (Signed)
Site area: right groin  Site Prior to Removal:  Level 0  Pressure Applied For 20  MINUTES    Minutes Beginning at 1455  Manual:   Yes.    Patient Status During Pull:  stable  Post Pull Groin Site:  Level 0  Post Pull Instructions Given:  Yes.    Post Pull Pulses Present:  Yes.    Dressing Applied:  Yes.    Comments:   

## 2014-03-04 NOTE — Progress Notes (Signed)
UR completed Leetta Hendriks K. Rhodie Cienfuegos, RN, BSN, MSHL, CCM  03/04/2014 11:02 AM

## 2014-03-04 NOTE — Interval H&P Note (Signed)
History and Physical Interval Note:  03/04/2014 7:51 AM  Dylan Malone  has presented today for surgery, with the diagnosis of Documented CAD after NSTEMI.   Planned Staged PCI. Lennox Grumblesudy Cordon - Spanish Interpreter present.   The various methods of treatment have been discussed with the patient and family. After consideration of risks, benefits and other options for treatment, the patient has consented to  Procedure(s): PERCUTANEOUS CORONARY STENT INTERVENTION (PCI-S) (N/A) as a surgical intervention .  The patient's history has been reviewed, patient examined, no change in status, stable for surgery.  I have reviewed the patient's chart and labs.  Questions were answered to the patient's satisfaction.     Liam Bossman W  Cath Lab Visit (complete for each Cath Lab visit)  Clinical Evaluation Leading to the Procedure:   ACS: Yes.    Non-ACS:    Anginal Classification: CCS II  Anti-ischemic medical therapy: Maximal Therapy (2 or more classes of medications)  Non-Invasive Test Results: No non-invasive testing performed  Prior CABG: No previous CABG  AUC Performed for case yesterday - no change.

## 2014-03-04 NOTE — CV Procedure (Signed)
PERCUTANEOUS CORONARY INTERVENTION REPORT  NAME:  Dylan Malone   MRN: [id number] DOB:  16-Jan-1946   ADMIT DATE: 02/28/2014 Procedure Date: 03/04/2014  INTERVENTIONAL CARDIOLOGIST: Leonie Man, M.D., MS PRIMARY CARE PROVIDER: Suzanna Obey, MD PRIMARY CARDIOLOGIST: Ena Dawley, MD or Fransico Him, MD  PATIENT:  Dylan Malone is a 69 y.o. male, native Spanish speaker ([employer] interpreter was used for consent and translation during the procedure) with a history of CAD, PAD, his prior stroke, type 2 diabetes and hypertension who is a minute with intermittent chest pain. He was found to be hypertensive. He ruled in the positive troponins was thought to be related to hypertensive urgency. He has been treated aggressively for his hypertension and was referred for cardiac catheterization yesterday that revealed severe two-vessel disease involving the LAD and RCA.  PRE-OPERATIVE DIAGNOSIS:    Non-STEMI  Severe 2 Vessel Disease  PROCEDURES PERFORMED:    Percutaneous Coronary Intervention of the mid RCA serial 90% stenoses using a Synergy DES 2.25 mm x 32 mm - 2.5 mm  Percutaneous coronary dimension of the mid LAD focal 99% with diffuse 50-60% - Synergy DES 2.5 mm x 32 mm- 2.75 mm  PROCEDURE: The patient was brought to the 2nd [facility] in the fasting state and prepped and draped in the usual sterile fashion for right femoral artery access.  Dr. Angelena Form was unsuccessful using radial access the preceding day. Therefore femoral access was chosen.  Sterile technique was used including antiseptics, cap, gloves, gown, hand hygiene, mask and sheet. Skin prep: Chlorhexidine.   Consent: Risks of procedure as well as the alternatives and risks of each were explained to the (patient/caregiver). Consent for procedure obtained.   Time Out: Verified patient identification, verified procedure, site/side was marked, verified correct patient position, special  equipment/implants available, medications/allergies/relevent history reviewed, required imaging and test results available. Performed.  Access:   RIGHT COMMON FEMORAL Artery: 6 Fr Sheath -  fluoroscopically guided modified Seldinger Technique   FINDINGS:  Hemodynamics:   Central Aortic Pressure / Mean: 170/63/103 mmHg  Coronary Anatomy:  Left Anterior Descending Artery: Large caliber vessel that courses to the apex. The proximal vessel has diffuse 40% stenosis. The mid vessel has diffuse 50% stenosis followed by a focal 99% stenosis. The distal vessel has a focal 50% stenosis. There are two moderate caliber diagonal vessels with non-obstructive plaque.    Right Coronary Artery: Large dominant vessel with 40% proximal stenosis. The mid vessel has serial 90% stenoses. The distal vessel is diffusely diseased with 70% stenosis just before the bifurcation into the small PDA and the diffusely diseased small posterolateral artery.  After reviewing the initial angiography, the culprit lesions are thought to be tandem .  Preparation were made to proceed with PCI on this lesion.  Percutaneous Coronary Intervention:    Lesion #1: Serial 90% stenoses in the mid RCA  -- reduced to 0% (per discussion with Dr. Angelena Form, the decision was made not to intervene upon the very distal part of the RCA at the bifurcation.)  Guide: 6 Fr   JR4 (there is difficulty with JR4 engaging leading to damping of pressures.) Guidewire: [place]  Predilation Balloon: Emerge 2.0 mm x 15 mm;   12 Atm x 20 Sec X 2, 14 Atm x 30 Sec Stent: Synergy DES 2.25 mm x 32 mm;   14 Atm x 30 Sec - final diameter distal vessel 2.3 mm Post-dilation Balloon: [place] Emerge 2.5 mm x 15 mm; 3 inflations from  distal to proximal  14 Atm x 45 Sec, 16 Atm x 45 Sec (most proximal)  Final Diameter: 2.56 mm proximal - tapered to 2.5 mm mid  Post deployment angiography in multiple views, with and without guidewire in place revealed excellent  stent deployment and lesion coverage.  There was no evidence of dissection or perforation. Attention was then turned to the LAD.  Percutaneous Coronary Intervention:   Lesion #2: Mid LAD 99% with diffuse 50-60% stenosis following the diagonal branch. Guide: 6 Fr   XB LAD 3.5 Guidewire: BMW After initial engagement with thick guiding catheter, pressures were not accurately corded, therefore the wire and balloon were removed, the catheter was flushed as was a pressure transducer. The original Prowater wire was no longer serviceable, therefore BMW wire was used.  Predilation Balloon: Emerge 2.0 mm x 15 mm;   10 Atm x 20 Sec X 2   Stent would not advance,  Predilation Balloon: [place] Emerge 2.5 mm x 15 mm;;   10 Atm x 20  X 2 Stent: Synergy 2.5 mm x 32 mm;   14 Atm x 30 Sec, - final distal diameter 2.5 mm Post-dilation Balloon: [place] 2.75 mm x 20 mm; middle two thirds of stent  14 Atm x 45 Sec,  -Final Diameter in the mid stent 2.75  Post deployment angiography in multiple views, with and without guidewire in place revealed excellent stent deployment and lesion coverage.  There was no evidence of dissection or perforation.  Catheters removed out of the body over wire.   Sheath - sutured in place, to be removed in the [facility] Procedure unit with manual pressure for hemostasis.   MEDICATIONS:  Anesthesia:  Local Lidocaine 20l  Sedation:  64m IV Versed, 75cg IV fentanyl ;   Premedication: 600 Plavix (last PM)  Omnipaque Contrast: 195 ml  Anticoagulation: Angiomax Bolus & drip  Anti-Platelet Agent:  Plavix (loaded yesterday)  IC NTG 200 mcg x 1  PATIENT DISPOSITION:    The patient was transferred to the PACU holding area in a hemodynamicaly stable, chest pain free condition.  The patient tolerated the procedure well, and there were no complications.  EBL:   < 30 ml  The patient was stable before, during, and after the procedure.  POST-OPERATIVE DIAGNOSIS:    Successful PCI  on long segments of both the mid RCA and mid LAD. There still residual distal disease more notable in the RCA then the LAD, however the distal RCA is not approachable by PCI due to the sizable vessel.     PLAN OF CARE:  Standard post femoral cath care - when necessary hydralazine ordered for hypertension in order to allow for sheath removal.  Dual antiplatelet therapy - essentially lifelong  Continue aggressive blood pressure control.  Anticipate possible discharge tomorrow.    HLeonie Man M.D., M.S. Interventional Cardiologist   Pager # [phone]

## 2014-03-04 NOTE — Progress Notes (Signed)
Site area: right groin  Site Prior to Removal:  Level 0  Pressure Applied For 20 MINUTES    Minutes Beginning at 20:40  Manual:   Yes.    Patient Status During Pull:  AXOX3  Post Pull Groin Site:  Level 0  Post Pull Instructions Given:  Yes.    Post Pull Pulses Present:  Yes.    Dressing Applied:  Yes.    Comments:  Pt tolerated sheath removal without complication or distress, will continue to monitor patient.

## 2014-03-05 ENCOUNTER — Encounter (HOSPITAL_COMMUNITY): Payer: Self-pay | Admitting: Physician Assistant

## 2014-03-05 DIAGNOSIS — I2510 Atherosclerotic heart disease of native coronary artery without angina pectoris: Secondary | ICD-10-CM | POA: Diagnosis present

## 2014-03-05 DIAGNOSIS — I639 Cerebral infarction, unspecified: Secondary | ICD-10-CM

## 2014-03-05 DIAGNOSIS — I10 Essential (primary) hypertension: Secondary | ICD-10-CM | POA: Diagnosis present

## 2014-03-05 DIAGNOSIS — I255 Ischemic cardiomyopathy: Secondary | ICD-10-CM

## 2014-03-05 DIAGNOSIS — E119 Type 2 diabetes mellitus without complications: Secondary | ICD-10-CM

## 2014-03-05 DIAGNOSIS — I350 Nonrheumatic aortic (valve) stenosis: Secondary | ICD-10-CM

## 2014-03-05 LAB — CBC
HEMATOCRIT: 30.4 % — AB (ref 39.0–52.0)
HEMOGLOBIN: 10.1 g/dL — AB (ref 13.0–17.0)
MCH: 27.8 pg (ref 26.0–34.0)
MCHC: 33.2 g/dL (ref 30.0–36.0)
MCV: 83.7 fL (ref 78.0–100.0)
Platelets: 200 10*3/uL (ref 150–400)
RBC: 3.63 MIL/uL — ABNORMAL LOW (ref 4.22–5.81)
RDW: 16.1 % — ABNORMAL HIGH (ref 11.5–15.5)
WBC: 8.9 10*3/uL (ref 4.0–10.5)

## 2014-03-05 LAB — GLUCOSE, CAPILLARY: GLUCOSE-CAPILLARY: 103 mg/dL — AB (ref 70–99)

## 2014-03-05 LAB — BASIC METABOLIC PANEL
Anion gap: 6 (ref 5–15)
BUN: 14 mg/dL (ref 6–23)
CHLORIDE: 108 meq/L (ref 96–112)
CO2: 25 mmol/L (ref 19–32)
Calcium: 9 mg/dL (ref 8.4–10.5)
Creatinine, Ser: 0.75 mg/dL (ref 0.50–1.35)
GFR calc non Af Amer: >90 mL/min (ref >90)
Glucose, Bld: 98 mg/dL (ref 70–99)
Potassium: 3.6 mmol/L (ref 3.5–5.1)
Sodium: 139 mmol/L (ref 135–145)

## 2014-03-05 MED ORDER — HYDRALAZINE HCL 25 MG PO TABS: 12.5000 mg | ORAL_TABLET | Freq: Three times a day (TID) | ORAL | Status: DC

## 2014-03-05 MED ORDER — ONDANSETRON HCL 4 MG PO TABS
4.0000 mg | ORAL_TABLET | Freq: Four times a day (QID) | ORAL | Status: DC | PRN
  Administered 2014-03-05: 4 mg via ORAL
  Filled 2014-03-05: qty 1

## 2014-03-05 MED ORDER — ATORVASTATIN CALCIUM 80 MG PO TABS: 80.0000 mg | ORAL_TABLET | Freq: Every day | ORAL | Status: DC

## 2014-03-05 MED ORDER — CARVEDILOL 6.25 MG PO TABS: 6.2500 mg | ORAL_TABLET | Freq: Two times a day (BID) | ORAL | Status: AC

## 2014-03-05 MED ORDER — ENALAPRIL MALEATE 20 MG PO TABS: 20.0000 mg | ORAL_TABLET | Freq: Two times a day (BID) | ORAL | Status: DC

## 2014-03-05 MED ORDER — CLOPIDOGREL BISULFATE 75 MG PO TABS: 75.0000 mg | ORAL_TABLET | Freq: Every day | ORAL | Status: AC

## 2014-03-05 MED ORDER — ISOSORBIDE MONONITRATE ER 30 MG PO TB24: 30.0000 mg | ORAL_TABLET | Freq: Every day | ORAL | Status: AC

## 2014-03-05 MED ORDER — NITROGLYCERIN 0.4 MG SL SUBL: 0.4000 mg | SUBLINGUAL_TABLET | SUBLINGUAL | Status: AC | PRN

## 2014-03-05 MED FILL — Sodium Chloride IV Soln 0.9%: INTRAVENOUS | Qty: 50 | Status: AC

## 2014-03-05 NOTE — Evaluation (Signed)
Occupational Therapy Evaluation Patient Details Name: Dylan Malone MRN: [id number] DOB: 10-21-1945 Today's Date: 03/05/2014    History of Present Illness Patient is a 69 yo male admitted 02/28/14 with chest/back pain.  Patient with HTN and NSTEMI.  PMH:  CAD, MI, PVD, RLE revascularization, HTN, CVA with Lt hemiparesis, DM.   Clinical Impression   PTA pt lived with his daughter who provided max (A) for transfers and LB ADLs. Pt reports that he is in a w/c and performs SPT with daughter assist. Pt limited by L sided hemiplegia, but is highly motivated to participate in therapy. Pt will benefit from SNF at d/c. All further OT needs will be deferred to SNF at this time.     Follow Up Recommendations  SNF;Supervision/Assistance - 24 hour    Equipment Recommendations  Other (comment) (Defer to SNF)    Recommendations for Other Services       Precautions / Restrictions Precautions Precautions: Fall Restrictions Weight Bearing Restrictions: No      Mobility Bed Mobility Overal bed mobility: Needs Assistance Bed Mobility: Supine to Sit     Supine to sit: Min assist;HOB elevated     General bed mobility comments: Min (A) to elevate and support trunk. Pt able to use RUE on bed rail to pull self up.   Transfers Overall transfer level: Needs assistance Equipment used: None Transfers: Stand Pivot Transfers;Sit to/from Stand Sit to Stand: Max assist Stand pivot transfers: Max assist       General transfer comment: Max (A) with gait belt assisted SPT. Blocked pt's LLE due to baseline weakness. Pt able to scoot RLE across floor to pivot to chair with max (A) for support and balance.     Balance Overall balance assessment: Needs assistance Sitting-balance support: No upper extremity supported;Feet supported Sitting balance-Leahy Scale: Fair Sitting balance - Comments: Decreased balance with attempts at dynamic activities   Standing balance support: Single extremity  supported;During functional activity Standing balance-Leahy Scale: Poor Standing balance comment: Pt held onto OT with RUE and required additional support at gait belt for balance and transfer.                             ADL Overall ADL's : Needs assistance/impaired Eating/Feeding: Set up;Sitting Eating/Feeding Details (indicate cue type and reason): pt requires assist to open containers and cut food. Pt able to eat with R hand Grooming: Oral care;Wash/dry face;Set up;Sitting   Upper Body Bathing: Minimal assitance;Sitting   Lower Body Bathing: Sitting/lateral leans;Maximal assistance   Upper Body Dressing : Minimal assistance;Sitting   Lower Body Dressing: Total assistance;Sitting/lateral leans   Toilet Transfer: Maximal assistance;Stand-pivot Toilet Transfer Details (indicate cue type and reason): bed>recliner using gait belt assisted SPT Toileting- Clothing Manipulation and Hygiene: Sitting/lateral lean;Minimal assistance Toileting - Clothing Manipulation Details (indicate cue type and reason): min (A) for balance       General ADL Comments: Pt has limited mobility at baseline and is likely at or close to baseline with ADLs. Pt is able to use RUE for self-care tasks and requires assist with with bathing and dressing.      Vision  Pt reports no change from baseline.                    Perception Perception Perception Tested?: No   Praxis Praxis Praxis tested?: Within functional limits    Pertinent Vitals/Pain Pain Assessment: No/denies pain     Hand Dominance Right  Extremity/Trunk Assessment Upper Extremity Assessment Upper Extremity Assessment: LUE deficits/detail LUE Deficits / Details: LUE impairment at baseline (CVA s/p 3 yrs). Pt able to extend composite digits and active flexion (not against resistence). Increased flexor tone. Unable to actively move shoulder or elbow LUE Coordination: decreased gross motor;decreased fine motor   Lower  Extremity Assessment Lower Extremity Assessment: Defer to PT evaluation   Cervical / Trunk Assessment Cervical / Trunk Assessment: Normal   Communication Communication Communication: No difficulties;Other (comment) (native spanish speaker but does well with [place])   Cognition Arousal/Alertness: Awake/alert Behavior During Therapy: Flat affect Overall Cognitive Status: Within Functional Limits for tasks assessed                                Home Living Family/patient expects to be discharged to:: Skilled nursing facility Living Arrangements: Children (daughter)                                      Prior Functioning/Environment Level of Independence: Needs assistance  Gait / Transfers Assistance Needed: Daughter has been providing max assist for OOB and transfers.  Patient unable to propel self in w/c. ADL's / Homemaking Assistance Needed: Total assist for meal prep.  Assist for bathing/dressing.        OT Diagnosis: Hemiplegia non-dominant side    End of Session Equipment Utilized During Treatment: Gait belt Nurse Communication: Mobility status  Activity Tolerance: Patient tolerated treatment well Patient left: in chair;with call bell/phone within reach   Time: 0853-0925 OT Time Calculation (min): 32 min Charges:  OT General Charges $OT Visit: 1 Procedure OT Evaluation $Initial OT Evaluation Tier I: 1 Procedure OT Treatments $Self Care/Home Management : 8-22 mins $Therapeutic Activity: 8-22 mins G-Codes:    Rae Lips 03/19/14, 9:39 AM   Carney Living, OTR/L Occupational Therapist [phone] (pager)

## 2014-03-05 NOTE — Discharge Instructions (Signed)
Sndrome coronario agudo (Acute Coronary Syndrome) El sndrome coronario agudo es un problema urgente en el que el suministro de [place] y oxgeno al corazn disminuye gravemente. Este sndrome requiere la hospitalizacin, porque pueden obstruirse una o ms arterias coronarias. El sndrome coronario agudo indica diversas afecciones, que incluyen:  Angina de pecho anterior que ahora es inestable, se prolonga ms, sucede en reposo o es ms intensa.  Un ataque cardaco, con el resultado de muerte y lesin en las clulas del msculo cardaco. Existen tres arterias coronarias fundamentales que suministran sangre y oxgeno al msculo cardaco para que este pueda bombear sangre con eficacia. Si estas arterias se obstruyen, el flujo sanguneo al msculo cardaco se reduce. Si el corazn no recibe suficiente sangre, la angina de pecho puede ser [employer] signo de alarma. SNTOMAS   Entre los signos ms frecuentes de la angina de pecho, se incluyen: 1. Sensacin de opresin en el pecho. 2. Sensacin de pesadez en el pecho. 3. [employer] brazos, el cuello, la espalda o la [place]. 4. Falta de aire y nuseas. 5. Piel fra y hmeda.  La angina de pecho por lo general es el resultado de un esfuerzo fsico o una situacin de excitacin que aumentan la necesidad de oxgeno del corazn. Estos estados aumentan la necesidad de flujo sanguneo hacia el corazn en mayor cantidad de la que se suministra.  Otros sntomas no tan frecuentes incluyen: 1. Fatiga. 2. Sensacin inexplicable de nerviosismo o ansiedad. 3. Debilidad. 4. Diarrea.  A veces, a pesar de no haber detectado ningn tipo de sntoma, se puede sufrir una lesin cardaca. TRATAMIENTO   Medicamentos que ayudan a reducir las [place], entre ellos, nitroglicerina (nitro) en forma de comprimidos o aerosol para Personnel officer un alivio rpido, o presentaciones de accin prolongada, como cremas, parches o cpsulas (tenga en cuenta que hay muchos  efectos secundarios y posibles interacciones con otros medicamentos).  Pueden usarse otros medicamentos para ayudar a que el Oceanographer.  Procedimientos para abrir las arterias obstruidas, que incluyen angioplastia o Nature conservation officer de un stent para Pharmacologist las arterias abiertas.  Ciruga a corazn abierto, que puede necesitarse cuando hay muchas obstrucciones o cuando estas estn en lugares crticos donde es ms conveniente [place]. INSTRUCCIONES PARA EL CUIDADO EN EL HOGAR   No consuma ningn producto que contenga tabaco, como cigarrillos, tabaco de Theatre manager o Administrator, Civil Service.  Si el mdico se lo indica, tome una aspirina infantil o para Regulatory affairs officer. Esto ayuda a reducir Nurse, adult de ataque cardaco.  Es muy importante que siga el tratamiento para la angina de pecho que le haya indicado el mdico. Oceanographer a las consultas de seguimiento.  Siga una dieta sana para el corazn, con las restricciones indicadas en cuanto a la sal y Programmer, multimedia.  La prctica regular de ejercicios es conveniente siempre que no le cause molestias. No comience ningn tipo de ejercicios Copy al mdico.  Si tiene sobrepeso, debe disminuirlo.  Trate de Pharmacologist niveles normales de lpidos en la sangre.  Mantenga la presin arterial controlada segn lo recomendado por el mdico.  Debe decirle al mdico de inmediato si aumenta la gravedad o la frecuencia de las molestias en el pecho o de los ataques de angina de [place]. En el momento del episodio de angina debe suspender lo que est haciendo y sentarse. Esto puede Engineer, materials en 3a . Si el mdico le ha indicado nitroglicerina, sela segn las indicaciones.  Si el mdico le ha dado Writer  para una visita de control, es importante que concurra. No concurrir a la visita puede derivar en que el dao, el dolor o la discapacidad sean permanentes o crnicos. Si tiene problemas para asistir al control, Multimedia programmerdeber llamar a este  establecimiento para recibir asesoramiento. SOLICITE ATENCIN MDICA DE INMEDIATO SI:   Tiene falta de aire, vmitos o nuseas.  Se marea o se desmaya.  Las Federal-Mogulmolestias en el pecho empeoran.  Tiene sudoraciones o una fatiga intensa repentina.  El dolor de pecho no cede despus de 3 dosis de nitroglicerina.  Las molestias duran ms de 15 minutos. ASEGRESE DE QUE:   Comprende estas instrucciones.  Controlar su afeccin.  Recibir ayuda de inmediato si no mejora o si empeora.  Tome todos los medicamentos como le indic el mdico. Document Released: 05/06/2008 Document Revised: 02/12/2013 [facility] IExitCare Patient Information 2015 [place], [place]. This information is not intended to replace advice given to you by your health care provider. Make sure you discuss any questions you have with your health care provider.      Groin Site Care Refer to this sheet in the next few weeks. These instructions provide you with information on caring for yourself after your procedure. Your caregiver may also give you more specific instructions. Your treatment has been planned according to current medical practices, but problems sometimes occur. Call your caregiver if you have any problems or questions after your procedure. HOME CARE INSTRUCTIONS  You may shower 24 hours after the procedure. Remove the bandage (dressing) and gently wash the site with plain soap and water. Gently pat the site dry.   Do not apply powder or lotion to the site.   Do not sit in a bathtub, swimming pool, or whirlpool for 5 to 7 days.   No bending, squatting, or lifting anything over 10 pounds (4.5 kg) as directed by your caregiver.   Inspect the site at least twice daily.   Do not drive home if you are discharged the same day of the procedure. Have someone else drive you.   You may drive 24 hours after the procedure unless otherwise instructed by your caregiver.  What to expect:  Any bruising will usually fade within 1  to 2 weeks.   Blood that collects in the tissue (hematoma) may be painful to the touch. It should usually decrease in size and tenderness within 1 to 2 weeks.  SEEK IMMEDIATE MEDICAL CARE IF:  You have unusual pain at the groin site or down the affected leg.   You have redness, warmth, swelling, or pain at the groin site.   You have drainage (other than a small amount of blood on the dressing).   You have chills.   You have a fever or persistent symptoms for more than 72 hours.   You have a fever and your symptoms suddenly get worse.   Your leg becomes pale, cool, tingly, or numb.  You have heavy bleeding from the site. Hold pressure on the site. .Marland Kitchen

## 2014-03-05 NOTE — Progress Notes (Signed)
Patient transferred to 916C03. CSW handoff report provided to Genelle BalVanessa Crawford, LCSW for 6C. [facility].  May require Letter of Guarantee due to Medicaid only status.  This CSW will sign off.  Lorri Frederickonna T. Jaci LazierCrowder, KentuckyLCSW [phone]

## 2014-03-05 NOTE — Discharge Summary (Signed)
Discharge Summary   Patient ID: Dylan Malone MRN: [id number], DOB/AGE: 09-03-1945 69 y.o. Admit date: 02/28/2014 D/C date:     03/12/2014  Primary Cardiologist: New (seen by Dr. Delton See first during admission)  Principal Problem:   NSTEMI (non-ST elevated myocardial infarction) Active Problems:   Hyperlipidemia   Aortic stenosis   CAD (coronary artery disease)   Ischemic cardiomyopathy   Diabetes mellitus without complication   Hypertension   Stroke    Admission Dates: 02/28/14-03/05/14 Discharge Diagnosis:   HPI: Dylan Malone is a 69M with CAD s/p MI without revascularization, PVD s/p RLE revascularization, HTN, and prior CVA with residual L sided hemiparesis who presented to [facility] on 02/28/14 with NSTEMI and hypertensive emergency.    Dylan Malone reports feeling back pain intermittently for 2 weeks. One day prior to presentation he also noted chest pain that was associated with the back pain. On the day of presentation he had an episode of isolated chest pain that prompted [facility]. Each episode lasted for approximately 30 minutes and was a 6/10 pulsing sensation. It did not feel like his prior MI's in 2012. Per the patient, [place] and he underwent heart catheterization but did not receive a stent or subsequent surgery. Since his CVA in 2012, Dylan Malone is unable to ambulate and he cannot push himself in a wheelchair. Therefore, he cannot assess whether the pain is exertional. He did not try taking nitroglycerin. He endorses some associated SOB but denies nausea, vomiting or diaphoresis.   Upon arrival in [facility] Dylan Malone was hypertensive to 212/64 with a HR of 54. He was started on a nitroglycerin infusion and received heparin and aspirin for his NSTEMI. He did not take any of his home medications today. His daughter provided a photo of his pill bottles. Dylan Malone daughter thinks that he is prescribed  simvastatin, though this is not among the medications in the photo. He is unsure whether he has been taking a statin.  Dylan Malone is very concerned about his inability to ambulate and swallow properly since his stroke. These are chronic issues and have not worsened recently. He underwent physical therapy and speech therapy in both [place] and [place]. He drinks nectar thick liquids and does not take any solid foods. Dylan Malone speaks mostly Spanish and some English.   Hospital Course  CAD/NSTEMI-  -- Peak troponin 0.08 -- s/p LHC on 03/03/14 which revealed  Impression: 1. Severe double vessel CAD 2. Unstable angina (class 4), NSTEMI 3. Moderate LV systolic dysfunction -- He was felt not to be a good candidate for CABG due to his inability to walk s/p CVA. Additionally, due to his inability to swallow thin liquids, he was not able to be safely loaded with plavix. This was crushed in applesauce and given to him later that night. He was hydrated post cath with planned PCI of the LAD and RCA the following day.  -- S/p Successful PCI on 03/04/14 on long segments of both the mid RCA and mid LAD. There still residual distal disease more notable in the RCA then the LAD, however the distal RCA is not approachable by PCI due to the sizable vessel. -- DAPT with ASA/plavix for lifetime. Continue atorvastatin 80 mg, enalapril 20 BID, and coreg to 6.25mg  BID as well as imdur  -- Radial and groin site stable  Moderate aortic stenosis: He has no HF symptoms -- 2D ECHO with  Trileaflet. There was no regurgitation. Mean gradient (S): 19 mm Hg. Peak gradient (S): 32 mm Hg. Valve area (VTI): 1.1 cm^2. Valve area (Vmax): 1.16 cm^2. Valve area (Vmean): 1.17 cm^2.  Hyperlipidemia - LDL 150, TAG 150, low HDL, continue high dose of atorvastatin, repeat in the outpatient settings, if still elevated he might be a candidate for PCSK 9 inhibitors  Hypertension - Coreg  increased to 6.25mg  BID for better control. This may be able to be increased further. Continue enalapril 20mg  BID.   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 Secondary to CAD vs. HTN - continue ACE I and BB -- No s/s volume overload.  Dispo- can likely be discharged today. Waiting SNF placement. I have called SW to see if this can be arranged today. MD to see for final disposition  The patient has had an uncomplicated hospital course and is recovering well. The femoral catheter site is stable. He has been seen by Dr. Tresa EndoKelly today and deemed ready for discharge SNF. All follow-up appointments have been scheduled. Discharge medications are listed below.   Discharge Vitals: Blood pressure 125/44, pulse 91, temperature 98.3 F (36.8 C), temperature source Oral, resp. rate 18, height 5\' 8"  (1.727 m), weight 158 lb 15.2 oz (72.1 kg), SpO2 96 %.  Labs: Lab Results  Component Value Date   WBC 8.9 03/05/2014   HGB 10.1* 03/05/2014   HCT 30.4* 03/05/2014   MCV 83.7 03/05/2014   PLT 200 03/05/2014   No results for input(s): NA, K, CL, CO2, BUN, CREATININE, CALCIUM, PROT, BILITOT, ALKPHOS, ALT, AST, GLUCOSE in the last 168 hours.  Invalid input(s): LABALBU   Lab Results  Component Value Date   CHOL 205* 03/01/2014   HDL 25* 03/01/2014   LDLCALC 150* 03/01/2014   TRIG 150* 03/01/2014     Diagnostic Studies/Procedures   Dg Chest Port 1 View  02/28/2014   CLINICAL DATA:  Chest pain and difficulty breathing for 1 day  EXAM: PORTABLE CHEST - 1 VIEW  COMPARISON:  None.  FINDINGS: There is mild left base atelectasis. Elsewhere lungs are clear. Heart is upper normal in size with pulmonary vascularity within normal limits. No adenopathy. There is atherosclerotic change in the aortic arch region. There is degenerative change in the thoracic spine.  IMPRESSION: Mild left base atelectasis.  No edema or consolidation.   Electronically Signed   By: Bretta BangWilliam  Woodruff M.D.   On: 02/28/2014 17:17    ECHO: 03/01/14 Left  ventricle: The cavity size was normal. Wall thickness was increased in a pattern of moderate LVH. Systolic function was moderately reduced. The estimated ejection fraction was in the range of 35% to 40%. Inferoseptal hypokinesis. Abnormal GLPSS at -15%, mostly inferiorly and inferoseptal. Doppler parameters are consistent with abnormal left ventricular relaxation (grade 1 diastolic dysfunction). The E/e&' ratio is >15, suggesting elevated LV filling pressure. - Aortic valve: Moderate aortic valve calcification. Moderate aortic stenosis. Trileaflet. There was no regurgitation. Mean gradient (S): 19 mm Hg. Peak gradient (S): 32 mm Hg. Valve area (VTI): 1.1 cm^2. Valve area (Vmax): 1.16 cm^2. Valve area (Vmean): 1.17 cm^2. - Mitral valve: Mildly thickened leaflets . There was mild regurgitation. - Left atrium: Severely dilated at 56 ml/m2. - Right atrium: The atrium was mildly dilated. - Tricuspid valve: There was no significant regurgitation. - Inferior vena cava: The vessel was normal in size. The respirophasic diameter changes were in the normal range (>= 50%), consistent with normal central venous pressure.   Discharge Medications  Medication List    STOP taking these medications        METOPROLOL SUCCINATE ER PO     simvastatin 20 MG tablet  Commonly known as:  ZOCOR      TAKE these medications        allopurinol 300 MG tablet  Commonly known as:  ZYLOPRIM  Take 300 mg by mouth daily.  Notes to Patient:  Gout      aspirin EC 81 MG tablet  Take 81 mg by mouth daily.  Notes to Patient:  Keep stent open     atorvastatin 80 MG tablet  Commonly known as:  LIPITOR  Take 1 tablet (80 mg total) by mouth daily at 6 PM.  Notes to Patient:  Cholesterol     benzonatate 100 MG capsule  Commonly known as:  TESSALON  Take by mouth 2 (two) times daily as needed for cough.  Notes to Patient:  Congested cough     carvedilol 6.25 MG tablet    Commonly known as:  COREG  Take 1 tablet (6.25 mg total) by mouth 2 (two) times daily with a meal.  Notes to Patient:  Decrease work of the heart     clopidogrel 75 MG tablet  Commonly known as:  PLAVIX  Take 1 tablet (75 mg total) by mouth daily.  Notes to Patient:  To keep stent open     enalapril 20 MG tablet  Commonly known as:  VASOTEC  Take 1 tablet (20 mg total) by mouth 2 (two) times daily.  Notes to Patient:  Blood pressure     hydrALAZINE 25 MG tablet  Commonly known as:  APRESOLINE  Take 0.5 tablets (12.5 mg total) by mouth every 8 (eight) hours.  Notes to Patient:  Blood pressure     isosorbide mononitrate 30 MG 24 hr tablet  Commonly known as:  IMDUR  Take 1 tablet (30 mg total) by mouth daily.  Notes to Patient:  Blood pressure     nitroGLYCERIN 0.4 MG SL tablet  Commonly known as:  NITROSTAT  Place 1 tablet (0.4 mg total) under the tongue every 5 (five) minutes x 3 doses as needed for chest pain.  Notes to Patient:  Emergency chest pain     propantheline 15 MG tablet  Commonly known as:  PROBANTHINE  Take 15 mg by mouth 3 (three) times daily with meals.     sertraline 100 MG tablet  Commonly known as:  ZOLOFT  Take 100 mg by mouth daily.  Notes to Patient:  Depression        Disposition   The patient will be discharged in stable condition to home.  Follow-up Information    Follow up with Janetta Hora, PA-C On 03/19/2014.   Specialty:  Cardiology   Why:  9am    Contact information:   [address] [place] [address] [phone]         Duration of Discharge Encounter: Greater than 30 minutes including physician and PA time.  SignedCline Crock R PA-C 03/12/2014, 10:35 AM

## 2014-03-05 NOTE — Progress Notes (Signed)
Pt is n/a for ambulation. Discussed Plavix, stents (although stent card not found-?daughter has it), NTG and diet. Pt with some confusion noted, asked us to fix his paralyzed left leg. RN to review NTG with daughter later. Left diet sheets in Spanish. Pt is n/a for CRPII as he is unable to walk. [phone] Ethelda ChickKristan Keenen Roessner CES, ACSM 9:53 AM 03/05/2014

## 2014-03-05 NOTE — Progress Notes (Signed)
Physical Therapy Treatment Patient Details Name: Dylan Malone MRN: [id number] DOB: 11/18/1945 Today's Date: 03/05/2014    History of Present Illness Patient is a 69 yo male admitted 02/28/14 with chest/back pain.  Patient with HTN and NSTEMI.  PMH:  CAD, MI, PVD, RLE revascularization, HTN, CVA with Lt hemiparesis, DM.    PT Comments    Pt making good progress and was able to tolerate gait in hallway today with therapist under L axilla and daughter assisting with R UE.  Pt left in recliner awaiting transfer to SNF today.    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  Provided assist under L axilla and at knee to stabilize and assist with WB through LLE.  Max facilitaiton for forward weight shift.   Ambulation/Gait Ambulation/Gait assistance: +2 physical assistance Ambulation Distance (Feet): 45 Feet Assistive device:  (PT under L axilla and daughter at RUE) Gait Pattern/deviations: Step-to pattern;Scissoring;Shuffle;Decreased stride length;Trunk flexed;Narrow base of support;Leaning posteriorly         Stairs            Wheelchair Mobility    Modified Rankin (Stroke Patients Only)       Balance Overall balance  assessment: Needs assistance Sitting-balance support: Feet supported;Single extremity supported Sitting balance-Leahy Scale: Fair Sitting balance - Comments: Decreased balance with attempts at dynamic activities Postural control: Posterior lean Standing balance support: During functional activity;Bilateral upper extremity supported Standing balance-Leahy Scale: Zero Standing balance comment: Pt held onto OT with RUE and required additional support at gait belt for balance and transfer.                     Cognition Arousal/Alertness: Awake/alert Behavior During Therapy: WFL for tasks assessed/performed Overall Cognitive Status: Within Functional Limits for tasks assessed                      Exercises      General Comments        Pertinent Vitals/Pain Pain Assessment: No/denies pain    Home Living Family/patient expects to be discharged to:: Skilled nursing facility Living Arrangements: Children (daughter)                  Prior Function Level of Independence: Needs assistance  Gait / Transfers Assistance Needed: Daughter has been providing max assist for OOB and transfers.  Patient unable to propel self in w/c. ADL's / Homemaking Assistance Needed: Total assist for meal prep.  Assist for bathing/dressing.     PT Goals (current goals can now be found in the care plan section) Acute Rehab PT Goals Patient Stated Goal: To have more therapy PT Goal Formulation: With patient/family Time For Goal Achievement: 03/16/14 Potential to  Achieve Goals: Fair Progress towards PT goals: Progressing toward goals    Frequency  Min 3X/week    PT Plan Current plan remains appropriate    Co-evaluation             End of Session   Activity Tolerance: Patient tolerated treatment well Patient left: in chair;with call bell/phone within reach;with family/visitor present     Time: 1139-1204 PT Time Calculation (min) (ACUTE ONLY): 25 min  Charges:  $Gait  Training: 8-22 mins $Therapeutic Activity: 8-22 mins                    G Codes:      Vista Deckarcell, Yuko Coventry Ann 03/05/2014, 12:29 PM

## 2014-03-05 NOTE — Progress Notes (Signed)
Patient discharged to [facility] via ambulance. Discharge instructions given to daughter prior to transfer. Patient had complaint of nausea and given zofran po prior to transfer.  Report given to ambulance staff.  1415 Report given to Ardyth Harpsana Huss at [facility]. Denies questions after report given.

## 2014-03-05 NOTE — Discharge Summary (Signed)
Discharge Summary   Patient ID: Dylan Malone MRN: [id number], DOB/AGE: 1945-05-10 69 y.o. Admit date: 02/28/2014 D/C date:     03/05/2014  Primary Cardiologist: New (seen by Dylan Malone first during admission)  Principal Problem:   NSTEMI (non-ST elevated myocardial infarction) Active Problems:   Hypertensive urgency   Hyperlipidemia   Aortic stenosis   CAD (coronary artery disease)   Ischemic cardiomyopathy   Diabetes mellitus without complication   Hypertension   Stroke   Admission Dates: 02/28/14-03/05/14 Discharge Diagnosis:  NSTEMI s/p successful PCI on 03/04/14 on long segments of both the mid RCA and mid LAD.  HPI: Dylan Malone is a 42M with CAD s/p MI without revascularization, PVD s/p RLE revascularization, HTN, and prior CVA with residual L sided hemiparesis who presented to [facility] on 02/28/14 with NSTEMI and hypertensive emergency.    Dylan Malone reports feeling back pain intermittently for 2 weeks. One day prior to presentation he also noted chest pain that was associated with the back pain. On the day of presentation he had an episode of isolated chest pain that prompted him to be evaluated at [facility]. Each episode lasted for approximately 30 minutes and was a 6/10 pulsing sensation. It did not feel like his prior MI's in 2012. Per the patient, these occurred in [place] and he underwent heart catheterization but did not receive a stent or subsequent surgery. Since his CVA in 2012, Dylan Malone is unable to ambulate and he cannot push himself in a wheelchair. Therefore, he cannot assess whether the pain is exertional. He did not try taking nitroglycerin. He endorses some associated SOB but denies nausea, vomiting or diaphoresis.   Upon arrival in [facility] Dylan Malone was hypertensive to 212/64 with a HR of 54. He was started on a nitroglycerin infusion and received heparin and aspirin for his NSTEMI. He did not take any of his home medications today. His  daughter provided a photo of his pill bottles. Dylan Malone daughter thinks that he is prescribed simvastatin, though this is not among the medications in the photo. He is unsure whether he has been taking a statin.  Dylan Malone is very concerned about his inability to ambulate and swallow properly since his stroke. These are chronic issues and have not worsened recently. He underwent physical therapy and speech therapy in both [place] and [place]. He drinks nectar thick liquids and does not take any solid foods. Dylan Malone speaks mostly Spanish and some English.   Hospital Course  CAD/NSTEMI-  -- Peak troponin 0.08 -- s/p LHC on 03/03/14 which revealed  Impression: 1. Severe double vessel CAD 2. Unstable angina (class 4), NSTEMI 3. Moderate LV systolic dysfunction -- He was felt not to be a good candidate for CABG due to his inability to walk s/p CVA. Additionally, due to his inability to swallow thin liquids, he was not able to be safely loaded with plavix. This was crushed in applesauce and given to him later that night. He was hydrated post cath with planned PCI of the LAD and RCA the following day.  -- S/p Successful PCI on 03/04/14 on long segments of both the mid RCA and mid LAD. There still residual distal disease more notable in the RCA then the LAD, however the distal RCA is not approachable by PCI due to the sizable vessel. -- DAPT with ASA/plavix for lifetime. Continue atorvastatin 80 mg, enalapril 20 BID, and coreg to 6.25mg  BID as well as imdur   -- Radial and groin site stable  Moderate aortic stenosis: He has no HF symptoms -- 2D ECHO with Trileaflet. There was no regurgitation. Mean gradient (S): 19 mm Hg. Peak gradient (S): 32 mm Hg. Valve area (VTI): 1.1 cm^2. Valve area (Vmax): 1.16 cm^2. Valve area (Vmean): 1.17 cm^2.  Hyperlipidemia - LDL 150, TAG 150, low HDL, continue high dose of atorvastatin, repeat in the  outpatient settings, if still elevated he might be a candidate for PCSK 9 inhibitors  Hypertension - Coreg increased to 6.25mg  BID for better control. Continue enalapril  BID and hydralazine 12.5mg  TID  Dilated CM with EF 35-40% ? Secondary to CAD vs. HTN - continue ACE I and BB -- No s/s volume overload.  Dispo- Discharge to SNF at [facility].  The patient has had an uncomplicated hospital course and is recovering well. The femoral catheter site is stable. He has been seen by Dylan Malone today and deemed ready for discharge SNF. All follow-up appointments have been scheduled. Discharge medications are listed below.   Discharge Vitals: Blood pressure 125/44, pulse 91, temperature 98.3 F (36.8 C), temperature source Oral, resp. rate 18, height  (1.727 m), weight 158 lb 15.2 oz (72.1 kg), SpO2 96 %.  Labs: Lab Results  Component Value Date   WBC 8.9 03/05/2014   HGB 10.1* 03/05/2014   HCT 30.4* 03/05/2014   MCV 83.7 03/05/2014   PLT 200 03/05/2014    Recent Labs Lab 02/28/14 1653  03/05/14 0300  NA 139  < > 139  K 4.0  < > 3.6  CL 102  < > 108  CO2 31  < > 25  BUN 16  < > 14  CREATININE 0.64  < > 0.75  CALCIUM 9.7  < > 9.0  PROT 9.1*  --   --   BILITOT 0.8  --   --   ALKPHOS 116  --   --   ALT 17  --   --   AST 24  --   --   GLUCOSE 99  < > 98  < > = values in this interval not displayed.   Lab Results  Component Value Date   CHOL 205* 03/01/2014   HDL 25* 03/01/2014   LDLCALC 150* 03/01/2014   TRIG 150* 03/01/2014     Diagnostic Studies/Procedures   Dg Chest Port 1 View  02/28/2014   CLINICAL DATA:  Chest pain and difficulty breathing for 1 day  EXAM: PORTABLE CHEST - 1 VIEW  COMPARISON:  None.  FINDINGS: There is mild left base atelectasis. Elsewhere lungs are clear. Heart is upper normal in size with pulmonary vascularity within normal limits. No adenopathy. There is atherosclerotic change in the aortic arch region. There is  degenerative change in the thoracic spine.  IMPRESSION: Mild left base atelectasis.  No edema or consolidation.   Electronically Signed   By: Dylan Malone M.D.   On: 02/28/2014 17:17     Cardiac Catheterization Operative Report 03/03/14  Procedure Performed:  1. Left Heart Catheterization 2. Selective Coronary Angiography 3. Left ventricular angiogram  Left Ventricular Angiogram: LVEF=35-40% with inferior wall hypokinesis.  Impression: 1. Severe double vessel CAD 2. Unstable angina (class 4), NSTEMI 3. Moderate LV systolic dysfunction Recommendations: He will need PCI of the mid LAD and the mid RCA. I do not think he is a candidate for CABG given his inability to walk following his CVA. He cannot ambulate and is confined to a wheelchair.  He would not be able to rehab following CABG. He does not swallow thin liquids following his CVA. I do not feel that I can safely load him with Plavix on the cath table today. Will crush Plavix 600 mg and give it in apple sauce tonight. Will hydrate post cath and will plan PCI of the LAD and RCA tomorrow. NPO at midnight tonight.      PERCUTANEOUS CORONARY INTERVENTION REPORT  03/04/14 FINDINGS:  Hemodynamics:   Central Aortic Pressure / Mean: 170/63/103 mmHg Coronary Anatomy:  Left Anterior Descending Artery: Large caliber vessel that courses to the apex. The proximal vessel has diffuse 40% stenosis. The mid vessel has diffuse 50% stenosis followed by a focal 99% stenosis. The distal vessel has a focal 50% stenosis. There are two moderate caliber diagonal vessels with non-obstructive plaque.   Right Coronary Artery: Large dominant vessel with 40% proximal stenosis. The mid vessel has serial 90% stenoses. The distal vessel is diffusely diseased with 70% stenosis just before the bifurcation into the small PDA and the diffusely diseased small posterolateral artery. POST-OPERATIVE DIAGNOSIS:   Successful PCI on long segments of both the mid  RCA and mid LAD. There still residual distal disease more notable in the RCA then the LAD, however the distal RCA is not approachable by PCI due to the sizable vessel.    ECHO: 03/01/14 Left ventricle: The cavity size was normal. Wall thickness was increased in a pattern of moderate LVH. Systolic function was moderately reduced. The estimated ejection fraction was in the range of 35% to 40%. Inferoseptal hypokinesis. Abnormal GLPSS at -15%, mostly inferiorly and inferoseptal. Doppler parameters are consistent with abnormal left ventricular relaxation (grade 1 diastolic dysfunction). The E/e&' ratio is >15, suggesting elevated LV filling pressure. - Aortic valve: Moderate aortic valve calcification. Moderate aortic stenosis. Trileaflet. There was no regurgitation. Mean gradient (S): 19 mm Hg. Peak gradient (S): 32 mm Hg. Valve area (VTI): 1.1 cm^2. Valve area (Vmax): 1.16 cm^2. Valve area (Vmean): 1.17 cm^2. - Mitral valve: Mildly thickened leaflets . There was mild regurgitation. - Left atrium: Severely dilated at 56 ml/m2. - Right atrium: The atrium was mildly dilated. - Tricuspid valve: There was no significant regurgitation. - Inferior vena cava: The vessel was normal in size. The respirophasic diameter changes were in the normal range (>= 50%), consistent with normal central venous pressure.   Discharge Medications     Medication List    STOP taking these medications        METOPROLOL SUCCINATE ER PO     simvastatin 20 MG tablet  Commonly known as:  ZOCOR      TAKE these medications        allopurinol 300 MG tablet  Commonly known as:  ZYLOPRIM  Take 300 mg by mouth daily.     aspirin EC 81 MG tablet  Take 81 mg by mouth daily.     atorvastatin 80 MG tablet  Commonly known as:  LIPITOR  Take 1 tablet (80 mg total) by mouth daily at 6 PM.     benzonatate 100 MG capsule  Commonly known as:  TESSALON  Take by mouth 2 (two) times daily  as needed for cough.     carvedilol 6.25 MG tablet  Commonly known as:  COREG  Take 1 tablet (6.25 mg total) by mouth 2 (two) times daily with a meal.     clopidogrel 75 MG tablet  Commonly known as:  PLAVIX  Take 1 tablet (75 mg total)  by mouth daily.     enalapril 20 MG tablet  Commonly known as:  VASOTEC  Take 1 tablet (20 mg total) by mouth 2 (two) times daily.     hydrALAZINE 25 MG tablet  Commonly known as:  APRESOLINE  Take 0.5 tablets (12.5 mg total) by mouth every 8 (eight) hours.     isosorbide mononitrate 30 MG 24 hr tablet  Commonly known as:  IMDUR  Take 1 tablet (30 mg total) by mouth daily.     nitroGLYCERIN 0.4 MG SL tablet  Commonly known as:  NITROSTAT  Place 1 tablet (0.4 mg total) under the tongue every 5 (five) minutes x 3 doses as needed for chest pain.     propantheline 15 MG tablet  Commonly known as:  PROBANTHINE  Take 15 mg by mouth 3 (three) times daily with meals.     sertraline 100 MG tablet  Commonly known as:  ZOLOFT  Take 100 mg by mouth daily.        Disposition   The patient will be discharged in stable condition to home.  Follow-up Information    Follow up with Janetta HoraHOMPSON, Seletha Zimmermann R, PA-C On 03/19/2014.   Specialty:  Cardiology   Why:  9am    Contact information:   [address] [place] [id number] [phone]         Duration of Discharge Encounter: Greater than 30 minutes including physician and PA time.  Byrd HesselbachSigned, Phoenix Riesen R PA-C 03/05/2014, 12:47 PM

## 2014-03-05 NOTE — Progress Notes (Addendum)
Patient Name: Dylan Malone Date of Encounter: 03/05/2014     Active Problems:   ACS (acute coronary syndrome)   NSTEMI (non-ST elevated myocardial infarction)   Hypertensive urgency   Hyperlipidemia   Acute coronary syndrome   Chest pain   Coronary artery disease involving native coronary artery of native heart with unstable angina pectoris   Aortic stenosis   CAD (coronary artery disease)   Ischemic cardiomyopathy   Diabetes mellitus without complication   Hypertension   Stroke    SUBJECTIVE  No CP or SOB. He does have some left leg pain .   CURRENT MEDS . allopurinol  300 mg Oral Daily  . aspirin EC  81 mg Oral Daily  . atorvastatin  80 mg Oral q1800  . carvedilol  6.25 mg Oral BID WC  . Chlorhexidine Gluconate Cloth  6 each Topical Q0600  . clopidogrel  75 mg Oral Daily  . enalapril  20 mg Oral BID  . heparin  5,000 Units Subcutaneous 3 times per day  . hydrALAZINE  12.5 mg Oral 3 times per day  . isosorbide mononitrate  30 mg Oral Daily  . mupirocin ointment  1 application Nasal BID  . sertraline  100 mg Oral Daily  . sodium chloride  3 mL Intravenous Q12H    OBJECTIVE  Filed Vitals:   03/04/14 2107 03/04/14 2300 03/05/14 0020 03/05/14 0706  BP: 162/52 157/58  162/89  Pulse: 68 76  85  Temp: 98.7 F (37.1 C) 98.1 F (36.7 C)  97.9 F (36.6 C)  TempSrc: Oral Oral  Oral  Resp: Height:      Weight:   158 lb 15.2 oz (72.1 kg)   SpO2: 98% 99%  96%    Intake/Output Summary (Last 24 hours) at 03/05/14 0730 Last data filed at 03/04/14 2109  Gross per 24 hour  Intake 1102.6 ml  Output      0 ml  Net 1102.6 ml   Filed Weights   03/04/14 0014 03/04/14 0420 03/05/14 0020  Weight: 158 lb 4.6 oz (71.8 kg) 158 lb 4.6 oz (71.8 kg) 158 lb 15.2 oz (72.1 kg)    PHYSICAL EXAM  General: Pleasant, NAD. chronically ill appearing Neuro: Alert and oriented X 3. Moves all extremities spontaneously. Psych: Normal affect. HEENT:  Normal  Neck: Supple  without bruits or JVD. Lungs:  Resp regular and unlabored, mild expiratory wheezes . Heart: RRR no s3, s4, + SEM  . Abdomen: Soft, non-tender, non-distended, BS + x 4.  Extremities: No clubbing, cyanosis or edema. DP/PT/Radials 2+ and equal bilaterally. Groin and radial site with no ecchymosis or hematoma  Accessory Clinical Findings  CBC  Recent Labs  03/04/14 1130 03/05/14 0300  WBC 8.7 8.9  HGB 11.2* 10.1*  HCT 33.2* 30.4*  MCV 85.3 83.7  PLT 198 200   Basic Metabolic Panel  Recent Labs  03/04/14 0351 03/04/14 1130 03/05/14 0300  NA 138  --  139  K 3.6  --  3.6  CL 107  --  108  CO2 22  --  25  GLUCOSE 111*  --  98  BUN 12  --  14  CREATININE 0.60 0.67 0.75  CALCIUM 9.6  --  9.0    TELE  NSR. HR 80s  Radiology/Studies  Dg Chest Port 1 View  02/28/2014   CLINICAL DATA:  Chest pain and difficulty breathing for 1 day  EXAM: PORTABLE CHEST - 1 VIEW  COMPARISON:  None.  FINDINGS: There is mild left base atelectasis. Elsewhere lungs are clear. Heart is upper normal in size with pulmonary vascularity within normal limits. No adenopathy. There is atherosclerotic change in the aortic arch region. There is degenerative change in the thoracic spine.  IMPRESSION: Mild left base atelectasis.  No edema or consolidation.   Electronically Signed   By: Bretta BangWilliam  Woodruff M.D.   On: 02/28/2014 17:17     ECHO: 03/01/14 Left ventricle: The cavity size was normal. Wall thickness was increased in a pattern of moderate LVH. Systolic function was moderately reduced. The estimated ejection fraction was in the range of 35% to 40%. Inferoseptal hypokinesis. Abnormal GLPSS at -15%, mostly inferiorly and inferoseptal. Doppler parameters are consistent with abnormal left ventricular relaxation (grade 1 diastolic dysfunction). The E/e&' ratio is >15, suggesting elevated LV filling pressure. - Aortic valve: Moderate aortic valve calcification. Moderate aortic stenosis.  Trileaflet. There was no regurgitation. Mean gradient (S): 19 mm Hg. Peak gradient (S): 32 mm Hg. Valve area (VTI): 1.1 cm^2. Valve area (Vmax): 1.16 cm^2. Valve area (Vmean): 1.17 cm^2. - Mitral valve: Mildly thickened leaflets . There was mild regurgitation. - Left atrium: Severely dilated at 56 ml/m2. - Right atrium: The atrium was mildly dilated. - Tricuspid valve: There was no significant regurgitation. - Inferior vena cava: The vessel was normal in size. The respirophasic diameter changes were in the normal range (>= 50%), consistent with normal central venous pressure.   ASSESSMENT AND PLAN  Dylan Malone is a 26M with CAD s/p MI without revascularization, PVD s/p RLE revascularization, HTN, and prior CVA with residual L sided hemiparesis who [facility] with NSTEMI and hypertensive emergency.   CAD/NSTEMI-  -- Peak troponin 0.08 -- s/p LHC on 03/03/14 which revealed  Impression:  1. Severe double vessel CAD  2. Unstable angina (class 4), NSTEMI  3. Moderate LV systolic dysfunction -- He was felt not to be a good candidate for CABG due to his inability to walk s/p CVA. Additionally, due to his inability to swallow thin liquids, he was not able to be safely loaded with plavix. This was crushed in applesauce and given to him later that night. He was hydrated post cath with planned PCI of the LAD and RCA the following day.  -- S/p Successful PCI on 03/04/14 on long segments of both the mid RCA and mid LAD. There still residual distal disease more notable in the RCA then the LAD, however the distal RCA is not approachable by PCI due to the sizable vessel. -- DAPT with ASA/plavix for lifetime. Continue atorvastatin 80 mg, enalapril 20 BID, and coreg to 6.25mg  BID as well as imdur 30mg  -- Radial and groin site stable  Moderate aortic stenosis: He has no HF symptoms -- 2D ECHO with Trileaflet. There was no regurgitation. Mean gradient (S): 19 mm Hg. Peak gradient  (S): 32 mm Hg. Valve area (VTI): 1.1 cm^2. Valve area (Vmax): 1.16 cm^2. Valve area (Vmean): 1.17 cm^2.  Hyperlipidemia - LDL 150, TAG 150, low HDL, continue high dose of atorvastatin, repeat in the outpatient settings, if still elevated he might be a candidate for PCSK 9 inhibitors  Hypertension - Coreg increased to 6.25mg  BID for better control. This may be able to be increased further. Continue enalapril 20mg  BID.   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 Secondary to CAD vs. HTN - continue ACE I and BB -- No s/s volume overload.  Dispo- can likely be discharged today. Waiting SNF  placement. I have called SW to see if this can be arranged today. MD to see for final disposition  Signed, Janetta Hora PA-C  Pager [phone]   Patient seen and examined. Agree with assessment and plan. No chest pain; s/p 2 vessel PCI to mid LAD and RCA.  Agree with dc plan. Pt to go to nursing facility.   Lennette Bihari, MD, [facility] 03/05/2014 9:44 AM

## 2014-03-09 ENCOUNTER — Encounter: Payer: Self-pay | Admitting: Internal Medicine

## 2014-03-09 ENCOUNTER — Non-Acute Institutional Stay (SKILLED_NURSING_FACILITY): Payer: Medicare Other | Admitting: Internal Medicine

## 2014-03-09 DIAGNOSIS — I255 Ischemic cardiomyopathy: Secondary | ICD-10-CM

## 2014-03-09 DIAGNOSIS — E119 Type 2 diabetes mellitus without complications: Secondary | ICD-10-CM

## 2014-03-09 DIAGNOSIS — I6930 Unspecified sequelae of cerebral infarction: Secondary | ICD-10-CM

## 2014-03-09 DIAGNOSIS — E785 Hyperlipidemia, unspecified: Secondary | ICD-10-CM

## 2014-03-09 DIAGNOSIS — I10 Essential (primary) hypertension: Secondary | ICD-10-CM

## 2014-03-09 DIAGNOSIS — I350 Nonrheumatic aortic (valve) stenosis: Secondary | ICD-10-CM

## 2014-03-09 DIAGNOSIS — I25110 Atherosclerotic heart disease of native coronary artery with unstable angina pectoris: Secondary | ICD-10-CM

## 2014-03-09 DIAGNOSIS — I214 Non-ST elevation (NSTEMI) myocardial infarction: Secondary | ICD-10-CM

## 2014-03-09 NOTE — Progress Notes (Signed)
Patient ID: Dylan Malone, male   DOB: [birth year], 69 y.o.   MRN: [id number]    HISTORY AND PHYSICAL  Location:    [facility]   Place of Service:   SNF  Extended Emergency Contact Information Primary Emergency Contact: Grahn,Jomayra Address: [place], [place] [address] [address] Phone: [phone] Relation: Daughter  Advanced Directive information  Full code; MOST form on chart  Chief Complaint  Patient presents with  . New Admit To SNF    NSTEMI, CAD s/p PCI to mid RCA and mid LAD on 03/04/14, moderate AS, ischemic cardiomyopathy, HTNsive urgency, DM II, hx CVA with left hemiparesis    HPI:  69 yo male seen today as a new admission to SNF for above. He is here for short term rehab.  He reports feeling better. Denies SOB or palpitations. He has CP with movement but no known trauma.  No HA or dizziness. CBG 95 today. No low BS reactions. No nrew numbness or tingling. Appetite is excellent. He is receiving PT and OT. No nursing issues.  Past Medical History  Diagnosis Date  . Stroke     a. wheelchair bound and swallowing difficulties  . Hypertension   . Diabetes mellitus without complication   . Ischemic cardiomyopathy     a. 03/01/14: 2D ECHO w/ LVEF 35-40%, mod LVH, inferosept/inf hypokinesis,  . CAD (coronary artery disease)     a. 03/04/14 [place] s/p DES to mLAD and DES to [facility]  . Aortic stenosis     a. 03/01/14 ECHO moderate with AVA 1.1, Pk grad 32.  Marland Kitchen HLD (hyperlipidemia)     Past Surgical History  Procedure Laterality Date  . Left heart catheterization with coronary angiogram N/A 03/03/2014    Procedure: LEFT HEART CATHETERIZATION WITH CORONARY ANGIOGRAM;  Surgeon: Burnell Blanks, MD;  Location: [facility] CATH LAB;  Service: Cardiovascular;  Laterality: N/A;  . Percutaneous coronary stent intervention (pci-s) N/A 03/04/2014    Procedure: PERCUTANEOUS CORONARY STENT INTERVENTION (PCI-S);  Surgeon: Leonie Man,  MD;  Location: [facility] CATH LAB;  Service: Cardiovascular;  Laterality: N/A;    Patient Care Team: Katherina Mires, MD as PCP - General (Family Medicine)  History   Social History  . Marital Status: Divorced    Spouse Name: N/A    Number of Children: N/A  . Years of Education: N/A   Occupational History  . Not on file.   Social History Main Topics  . Smoking status: Never Smoker   . Smokeless tobacco: Not on file  . Alcohol Use: No  . Drug Use: Not on file  . Sexual Activity: Not on file   Other Topics Concern  . Not on file   Social History Narrative     reports that he has never smoked. He does not have any smokeless tobacco history on file. He reports that he does not drink alcohol. His drug history is not on file.  No family history on file. No family status information on file.     There is no immunization history on file for this patient.  No Known Allergies  Medications: Patient's Medications  New Prescriptions   No medications on file  Previous Medications   ALLOPURINOL (ZYLOPRIM) 300 MG TABLET    Take 300 mg by mouth daily.   ASPIRIN EC 81 MG TABLET    Take 81 mg by mouth daily.   ATORVASTATIN (LIPITOR) 80  MG TABLET    Take 1 tablet (80 mg total) by mouth daily at 6 PM.   BENZONATATE (TESSALON) 100 MG CAPSULE    Take by mouth 2 (two) times daily as needed for cough.   CARVEDILOL (COREG) 6.25 MG TABLET    Take 1 tablet (6.25 mg total) by mouth 2 (two) times daily with a meal.   CLOPIDOGREL (PLAVIX) 75 MG TABLET    Take 1 tablet (75 mg total) by mouth daily.   ENALAPRIL (VASOTEC) 20 MG TABLET    Take 1 tablet (20 mg total) by mouth 2 (two) times daily.   HYDRALAZINE (APRESOLINE) 25 MG TABLET    Take 0.5 tablets (12.5 mg total) by mouth every 8 (eight) hours.   ISOSORBIDE MONONITRATE (IMDUR) 30 MG 24 HR TABLET    Take 1 tablet (30 mg total) by mouth daily.   NITROGLYCERIN (NITROSTAT) 0.4 MG SL TABLET    Place 1 tablet (0.4 mg total) under the tongue every 5  (five) minutes x 3 doses as needed for chest pain.   PROPANTHELINE (PROBANTHINE) 15 MG TABLET    Take 15 mg by mouth 3 (three) times daily with meals.   SERTRALINE (ZOLOFT) 100 MG TABLET    Take 100 mg by mouth daily.  Modified Medications   No medications on file  Discontinued Medications   No medications on file    Review of Systems  As above. All other systems reviewed are negative.  Past medical, surgical, family and social history reviewed   Filed Vitals:   03/09/14 0054  BP: 132/81  Pulse: 87  Temp: 97 F (36.1 C)  SpO2: 97%   There is no weight on file to calculate BMI.  Physical Exam  CONSTITUTIONAL: Looks well in NAD. Awake, alert and oriented x 3 HEENT: PERRLA. Oropharynx clear and without exudate.MMM NECK: Supple. Nontender. No palpable cervical or supraclavicular lymph nodes. (+) carotid bruit b/l. No thyromegaly or thyroid mass palpable.  CVS: Regular rate with 2/6 SEmurmur. No gallop or rub. LUNGS: CTA b/l no wheezing, rales or rhonchi. CP reproducible ABDOMEN: Bowel sounds present x 4. Soft, nontender, nondistended. No palpable mass or bruit EXTREMITIES: Trace LE edema b/l. Distal pulses palpable. No calf tenderness PSYCH: Affect, behavior and mood normal MUSC: left rib 6 stuck up and TTP. No rash Labs reviewed: Admission on 02/28/2014, Discharged on 03/05/2014  Component Date Value Ref Range Status  . WBC 02/28/2014 10.4  4.0 - 10.5 K/uL Final  . RBC 02/28/2014 4.66  4.22 - 5.81 MIL/uL Final  . Hemoglobin 02/28/2014 13.0  13.0 - 17.0 g/dL Final  . HCT 02/28/2014 39.5  39.0 - 52.0 % Final  . MCV 02/28/2014 84.8  78.0 - 100.0 fL Final  . MCH 02/28/2014 27.9  26.0 - 34.0 pg Final  . MCHC 02/28/2014 32.9  30.0 - 36.0 g/dL Final  . RDW 02/28/2014 15.7* 11.5 - 15.5 % Final  . Platelets 02/28/2014 223  150 - 400 K/uL Final  . Sodium 02/28/2014 139  135 - 145 mmol/L Final   Please note change in reference range.  . Potassium 02/28/2014 4.0  3.5 - 5.1 mmol/L  Final   Please note change in reference range.  . Chloride 02/28/2014 102  96 - 112 mEq/L Final  . CO2 02/28/2014 31  19 - 32 mmol/L Final  . Glucose, Bld 02/28/2014 99  70 - 99 mg/dL Final  . BUN 02/28/2014 16  6 - 23 mg/dL Final  . Creatinine, Ser 02/28/2014  0.64  0.50 - 1.35 mg/dL Final  . Calcium 02/28/2014 9.7  8.4 - 10.5 mg/dL Final  . Total Protein 02/28/2014 9.1* 6.0 - 8.3 g/dL Final  . Albumin 02/28/2014 4.7  3.5 - 5.2 g/dL Final  . AST 02/28/2014 24  0 - 37 U/L Final  . ALT 02/28/2014 17  0 - 53 U/L Final  . Alkaline Phosphatase 02/28/2014 116  39 - 117 U/L Final  . Total Bilirubin 02/28/2014 0.8  0.3 - 1.2 mg/dL Final  . GFR calc non Af Amer 02/28/2014 >90  >90 mL/min Final  . GFR calc Af Amer 02/28/2014 >90  >90 mL/min Final   Comment: (NOTE) The eGFR has been calculated using the CKD EPI equation. This calculation has not been validated in all clinical situations. eGFR's persistently <90 mL/min signify possible Chronic Kidney Disease.   . Anion gap 02/28/2014 6  5 - 15 Final  . Troponin I 02/28/2014 0.08* <0.031 ng/mL Final   Comment:        PERSISTENTLY INCREASED TROPONIN VALUES IN THE RANGE OF 0.04-0.49 ng/mL CAN BE SEEN IN:       -UNSTABLE ANGINA       -CONGESTIVE HEART FAILURE       -MYOCARDITIS       -CHEST TRAUMA       -ARRYHTHMIAS       -LATE PRESENTING MYOCARDIAL INFARCTION       -COPD   CLINICAL FOLLOW-UP RECOMMENDED. Please note change in reference range.   . Heparin Unfractionated 03/01/2014 0.23* 0.30 - 0.70 IU/mL Final   Comment:        IF HEPARIN RESULTS ARE BELOW EXPECTED VALUES, AND PATIENT DOSAGE HAS BEEN CONFIRMED, SUGGEST FOLLOW UP TESTING OF ANTITHROMBIN III LEVELS.   . WBC 03/01/2014 7.8  4.0 - 10.5 K/uL Final  . RBC 03/01/2014 4.03* 4.22 - 5.81 MIL/uL Final  . Hemoglobin 03/01/2014 11.1* 13.0 - 17.0 g/dL Final  . HCT 03/01/2014 33.3* 39.0 - 52.0 % Final  . MCV 03/01/2014 82.6  78.0 - 100.0 fL Final  . MCH 03/01/2014 27.5  26.0 -  34.0 pg Final  . MCHC 03/01/2014 33.3  30.0 - 36.0 g/dL Final  . RDW 03/01/2014 15.8* 11.5 - 15.5 % Final  . Platelets 03/01/2014 201  150 - 400 K/uL Final  . Troponin I 02/28/2014 0.06* <0.031 ng/mL Final   Comment:        PERSISTENTLY INCREASED TROPONIN VALUES IN THE RANGE OF 0.04-0.49 ng/mL CAN BE SEEN IN:       -UNSTABLE ANGINA       -CONGESTIVE HEART FAILURE       -MYOCARDITIS       -CHEST TRAUMA       -ARRYHTHMIAS       -LATE PRESENTING MYOCARDIAL INFARCTION       -COPD   CLINICAL FOLLOW-UP RECOMMENDED. Please note change in reference range.   . Troponin I 03/01/2014 0.06* <0.031 ng/mL Final   Comment:        PERSISTENTLY INCREASED TROPONIN VALUES IN THE RANGE OF 0.04-0.49 ng/mL CAN BE SEEN IN:       -UNSTABLE ANGINA       -CONGESTIVE HEART FAILURE       -MYOCARDITIS       -CHEST TRAUMA       -ARRYHTHMIAS       -LATE PRESENTING MYOCARDIAL INFARCTION       -COPD   CLINICAL FOLLOW-UP RECOMMENDED. Please note change in reference range.   Marland Kitchen  Hgb A1c MFr Bld 02/28/2014 5.4  <5.7 % Final   Comment: (NOTE)                                                                       According to the ADA Clinical Practice Recommendations for 2011, when HbA1c is used as a screening test:  >=6.5%   Diagnostic of Diabetes Mellitus           (if abnormal result is confirmed) 5.7-6.4%   Increased risk of developing Diabetes Mellitus References:Diagnosis and Classification of Diabetes Mellitus,Diabetes BMWU,1324,40(NUUVO 1):S62-S69 and Standards of Medical Care in         Diabetes - 2011,Diabetes ZDGU,4403,47 (Suppl 1):S11-S61.   . Mean Plasma Glucose 02/28/2014 108  <117 mg/dL Final   Performed at [facility]  . Sodium 03/01/2014 139  135 - 145 mmol/L Final   Please note change in reference range.  . Potassium 03/01/2014 3.5  3.5 - 5.1 mmol/L Final   Please note change in reference range.  . Chloride 03/01/2014 103  96 - 112 mEq/L Final  . CO2 03/01/2014 25  19 - 32  mmol/L Final  . Glucose, Bld 03/01/2014 110* 70 - 99 mg/dL Final  . BUN 03/01/2014 12  6 - 23 mg/dL Final  . Creatinine, Ser 03/01/2014 0.69  0.50 - 1.35 mg/dL Final  . Calcium 03/01/2014 9.3  8.4 - 10.5 mg/dL Final  . GFR calc non Af Amer 03/01/2014 >90  >90 mL/min Final  . GFR calc Af Amer 03/01/2014 >90  >90 mL/min Final   Comment: (NOTE) The eGFR has been calculated using the CKD EPI equation. This calculation has not been validated in all clinical situations. eGFR's persistently <90 mL/min signify possible Chronic Kidney Disease.   . Anion gap 03/01/2014 11  5 - 15 Final  . Total CK 02/28/2014 46  7 - 232 U/L Final  . CK, MB 02/28/2014 1.7  0.3 - 4.0 ng/mL Final  . Relative Index 02/28/2014 RELATIVE INDEX IS INVALID  0.0 - 2.5 Final   Comment: WHEN CK < 100 U/L          . MRSA by PCR 02/28/2014 POSITIVE* NEGATIVE Final   Comment:        The GeneXpert MRSA Assay (FDA approved for NASAL specimens only), is one component of a comprehensive MRSA colonization surveillance program. It is not intended to diagnose MRSA infection nor to guide or monitor treatment for MRSA infections. RESULT CALLED TO, READ BACK BY AND VERIFIED WITH: HILDA MOOSE,RN 03/01/2014 [place]   . Cholesterol 03/01/2014 205* 0 - 200 mg/dL Final  . Triglycerides 03/01/2014 150* <150 mg/dL Final  . HDL 03/01/2014 25* >39 mg/dL Final  . Total CHOL/HDL Ratio 03/01/2014 8.2   Final  . VLDL 03/01/2014 30  0 - 40 mg/dL Final  . LDL Cholesterol 03/01/2014 150* 0 - 99 mg/dL Final   Comment:        Total Cholesterol/HDL:CHD Risk Coronary Heart Disease Risk Table                     Men   Women  1/2 Average Risk   3.4   3.3  Average Risk  5.0   4.4  2 X Average Risk   9.6   7.1  3 X Average Risk  23.4   11.0        Use the calculated Patient Ratio above and the CHD Risk Table to determine the patient's CHD Risk.        ATP III CLASSIFICATION (LDL):  <100     mg/dL   Optimal  100-129  mg/dL    Near or Above                    Optimal  130-159  mg/dL   Borderline  160-189  mg/dL   High  >190     mg/dL   Very High   . Heparin Unfractionated 03/01/2014 0.50  0.30 - 0.70 IU/mL Final   Comment:        IF HEPARIN RESULTS ARE BELOW EXPECTED VALUES, AND PATIENT DOSAGE HAS BEEN CONFIRMED, SUGGEST FOLLOW UP TESTING OF ANTITHROMBIN III LEVELS.   Marland Kitchen Heparin Unfractionated 03/01/2014 0.40  0.30 - 0.70 IU/mL Final   Comment:        IF HEPARIN RESULTS ARE BELOW EXPECTED VALUES, AND PATIENT DOSAGE HAS BEEN CONFIRMED, SUGGEST FOLLOW UP TESTING OF ANTITHROMBIN III LEVELS.   Marland Kitchen Heparin Unfractionated 03/02/2014 0.41  0.30 - 0.70 IU/mL Final   Comment:        IF HEPARIN RESULTS ARE BELOW EXPECTED VALUES, AND PATIENT DOSAGE HAS BEEN CONFIRMED, SUGGEST FOLLOW UP TESTING OF ANTITHROMBIN III LEVELS.   . WBC 03/02/2014 8.4  4.0 - 10.5 K/uL Final  . RBC 03/02/2014 4.13* 4.22 - 5.81 MIL/uL Final  . Hemoglobin 03/02/2014 11.2* 13.0 - 17.0 g/dL Final  . HCT 03/02/2014 34.2* 39.0 - 52.0 % Final  . MCV 03/02/2014 82.8  78.0 - 100.0 fL Final  . MCH 03/02/2014 27.1  26.0 - 34.0 pg Final  . MCHC 03/02/2014 32.7  30.0 - 36.0 g/dL Final  . RDW 03/02/2014 15.9* 11.5 - 15.5 % Final  . Platelets 03/02/2014 193  150 - 400 K/uL Final  . Heparin Unfractionated 03/03/2014 0.34  0.30 - 0.70 IU/mL Final   Comment:        IF HEPARIN RESULTS ARE BELOW EXPECTED VALUES, AND PATIENT DOSAGE HAS BEEN CONFIRMED, SUGGEST FOLLOW UP TESTING OF ANTITHROMBIN III LEVELS.   . WBC 03/03/2014 10.1  4.0 - 10.5 K/uL Final  . RBC 03/03/2014 4.42  4.22 - 5.81 MIL/uL Final  . Hemoglobin 03/03/2014 12.5* 13.0 - 17.0 g/dL Final  . HCT 03/03/2014 37.9* 39.0 - 52.0 % Final  . MCV 03/03/2014 85.7  78.0 - 100.0 fL Final  . MCH 03/03/2014 28.3  26.0 - 34.0 pg Final  . MCHC 03/03/2014 33.0  30.0 - 36.0 g/dL Final  . RDW 03/03/2014 15.9* 11.5 - 15.5 % Final  . Platelets 03/03/2014 245  150 - 400 K/uL Final  . Prothrombin Time  03/03/2014 13.4  11.6 - 15.2 seconds Final  . INR 03/03/2014 1.01  0.00 - 1.49 Final  . WBC 03/04/2014 9.8  4.0 - 10.5 K/uL Final  . RBC 03/04/2014 4.14* 4.22 - 5.81 MIL/uL Final  . Hemoglobin 03/04/2014 11.2* 13.0 - 17.0 g/dL Final  . HCT 03/04/2014 34.4* 39.0 - 52.0 % Final  . MCV 03/04/2014 83.1  78.0 - 100.0 fL Final  . MCH 03/04/2014 27.1  26.0 - 34.0 pg Final  . MCHC 03/04/2014 32.6  30.0 - 36.0 g/dL Final  . RDW 03/04/2014 16.0* 11.5 - 15.5 % Final  .  Platelets 03/04/2014 240  150 - 400 K/uL Final  . Activated Clotting Time 03/03/2014 140   Final  . Activated Clotting Time 03/03/2014 190   Final  . Sodium 03/04/2014 138  135 - 145 mmol/L Final   Please note change in reference range.  . Potassium 03/04/2014 3.6  3.5 - 5.1 mmol/L Final   Please note change in reference range.  . Chloride 03/04/2014 107  96 - 112 mEq/L Final  . CO2 03/04/2014 22  19 - 32 mmol/L Final  . Glucose, Bld 03/04/2014 111* 70 - 99 mg/dL Final  . BUN 03/04/2014 12  6 - 23 mg/dL Final  . Creatinine, Ser 03/04/2014 0.60  0.50 - 1.35 mg/dL Final  . Calcium 03/04/2014 9.6  8.4 - 10.5 mg/dL Final  . GFR calc non Af Amer 03/04/2014 >90  >90 mL/min Final  . GFR calc Af Amer 03/04/2014 >90  >90 mL/min Final   Comment: (NOTE) The eGFR has been calculated using the CKD EPI equation. This calculation has not been validated in all clinical situations. eGFR's persistently <90 mL/min signify possible Chronic Kidney Disease.   . Anion gap 03/04/2014 9  5 - 15 Final  . WBC 03/04/2014 8.7  4.0 - 10.5 K/uL Final  . RBC 03/04/2014 3.89* 4.22 - 5.81 MIL/uL Final  . Hemoglobin 03/04/2014 11.2* 13.0 - 17.0 g/dL Final  . HCT 03/04/2014 33.2* 39.0 - 52.0 % Final  . MCV 03/04/2014 85.3  78.0 - 100.0 fL Final  . MCH 03/04/2014 28.8  26.0 - 34.0 pg Final  . MCHC 03/04/2014 33.7  30.0 - 36.0 g/dL Final  . RDW 03/04/2014 16.1* 11.5 - 15.5 % Final  . Platelets 03/04/2014 198  150 - 400 K/uL Final  . Creatinine, Ser 03/04/2014  0.67  0.50 - 1.35 mg/dL Final  . GFR calc non Af Amer 03/04/2014 >90  >90 mL/min Final  . GFR calc Af Amer 03/04/2014 >90  >90 mL/min Final   Comment: (NOTE) The eGFR has been calculated using the CKD EPI equation. This calculation has not been validated in all clinical situations. eGFR's persistently <90 mL/min signify possible Chronic Kidney Disease.   . Glucose-Capillary 03/04/2014 104* 70 - 99 mg/dL Final  . WBC 03/05/2014 8.9  4.0 - 10.5 K/uL Final  . RBC 03/05/2014 3.63* 4.22 - 5.81 MIL/uL Final  . Hemoglobin 03/05/2014 10.1* 13.0 - 17.0 g/dL Final  . HCT 03/05/2014 30.4* 39.0 - 52.0 % Final  . MCV 03/05/2014 83.7  78.0 - 100.0 fL Final  . MCH 03/05/2014 27.8  26.0 - 34.0 pg Final  . MCHC 03/05/2014 33.2  30.0 - 36.0 g/dL Final  . RDW 03/05/2014 16.1* 11.5 - 15.5 % Final  . Platelets 03/05/2014 200  150 - 400 K/uL Final  . Sodium 03/05/2014 139  135 - 145 mmol/L Final   Please note change in reference range.  . Potassium 03/05/2014 3.6  3.5 - 5.1 mmol/L Final   Please note change in reference range.  . Chloride 03/05/2014 108  96 - 112 mEq/L Final  . CO2 03/05/2014 25  19 - 32 mmol/L Final  . Glucose, Bld 03/05/2014 98  70 - 99 mg/dL Final  . BUN 03/05/2014 14  6 - 23 mg/dL Final  . Creatinine, Ser 03/05/2014 0.75  0.50 - 1.35 mg/dL Final  . Calcium 03/05/2014 9.0  8.4 - 10.5 mg/dL Final  . GFR calc non Af Amer 03/05/2014 >90  >90 mL/min Final  . GFR calc Af  Amer 03/05/2014 >90  >90 mL/min Final   Comment: (NOTE) The eGFR has been calculated using the CKD EPI equation. This calculation has not been validated in all clinical situations. eGFR's persistently <90 mL/min signify possible Chronic Kidney Disease.   . Anion gap 03/05/2014 6  5 - 15 Final  . Activated Clotting Time 03/04/2014 595   Final  . Glucose-Capillary 03/04/2014 112* 70 - 99 mg/dL Final  . Comment 1 03/04/2014 Notify RN   Final  . Glucose-Capillary 03/04/2014 79  70 - 99 mg/dL Final  . Comment 1  03/04/2014 Notify RN   Final  . Comment 2 03/04/2014 Documented in Chart   Final  . Glucose-Capillary 03/05/2014 103* 70 - 99 mg/dL Final  . Comment 1 03/05/2014 Notify RN   Final  . Comment 2 03/05/2014 Documented in Chart   Final    Dg Chest Port 1 View  02/28/2014   CLINICAL DATA:  Chest pain and difficulty breathing for 1 day  EXAM: PORTABLE CHEST - 1 VIEW  COMPARISON:  None.  FINDINGS: There is mild left base atelectasis. Elsewhere lungs are clear. Heart is upper normal in size with pulmonary vascularity within normal limits. No adenopathy. There is atherosclerotic change in the aortic arch region. There is degenerative change in the thoracic spine.  IMPRESSION: Mild left base atelectasis.  No edema or consolidation.   Electronically Signed   By: Lowella Grip M.D.   On: 02/28/2014 17:17   Hospital records reviewed  Assessment/Plan     ICD-9-CM ICD-10-CM   1. Coronary artery disease involving native coronary artery of native heart with unstable angina pectoris 414.01 I25.110    411.1    2. NSTEMI (non-ST elevated myocardial infarction)- treated with PCI and meds 410.70 I21.4   3. Essential hypertension 401.9 I10   4. Diabetes mellitus without complication 220.25 K27.0   5. Hyperlipidemia 272.4 E78.5   6. Ischemic cardiomyopathy 414.8 I25.5   7. Aortic stenosis 424.1 I35.0   8. History of CVA with residual deficit of dysphagia and left sided hemiparesis 438.9 I69.30   9. Atypical CP most likely musculoskeletal- reassured pt. continue observation  --continue current medications as ordered  --continue PT/OT  --f/u with cardiology on 03/19/14 at 0900 as scheduled  --continue daily CBGs and record. If BS trends >150, will consider adding oral hypoglycemic and/or insulin  GOAL: short term rehab s/p MI and d/c home when medically appropriate.    Saaya Procell S. Perlie Gold  [facility] [address] [place], [place]  [address] [phone] Office (Wednesdays and Fridays 8 AM - 5 PM) [phone] Cell (Monday-Friday 8 AM - 5 PM)

## 2014-03-19 ENCOUNTER — Ambulatory Visit (INDEPENDENT_AMBULATORY_CARE_PROVIDER_SITE_OTHER): Payer: Medicare Other | Admitting: Physician Assistant

## 2014-03-19 ENCOUNTER — Encounter: Payer: Self-pay | Admitting: Physician Assistant

## 2014-03-19 VITALS — BP 132/78 | HR 65 | Ht 68.0 in

## 2014-03-19 DIAGNOSIS — I2510 Atherosclerotic heart disease of native coronary artery without angina pectoris: Secondary | ICD-10-CM

## 2014-03-19 NOTE — Patient Instructions (Addendum)
Your physician recommends that you continue on your current medications as directed. Please refer to the Current Medication list given to you today.  Your physician wants you to follow-up in: 4 to 6 months with Dr. Delton SeeNelson. You will receive a reminder letter in the mail two months in advance. If you don't receive a letter, please call our office to schedule the follow-up appointment.

## 2014-03-19 NOTE — Progress Notes (Signed)
Cardiology Office Note   Date:  03/19/2014   ID:  Dylan Malone, DOB 1945-12-15, MRN [id number]  PCP:  Delbert HarnessBRISCOE, KIM, MD  Cardiologist: Dr. Delton SeeNelson   Post hospital follow up for CAD   History of Present Illness: Dylan SpanielJose Malone is a 69 y.o. male who presents for post hospital follow up after NSTEMI.   [name] is a 58M with CAD s/p MI without revascularization, PVD s/p RLE revascularization, HTN, and prior CVA with residual L sided hemiparesis who presented to [facility] on [facility]14 with NSTEMI and hypertensive emergency.  Per the patient, he has had previous [place] in [place] and he underwent heart catheterization in 2012 but did not receive a stent or subsequent surgery. He also suffered a CVA in 2012, after which he has been unable to ambulate and cannot push himself in a wheelchair. He drinks nectar thick liquids and does not take any solid foods. [name] speaks mostly Spanish and some English. [facility] 02/[facility]- 03/05/13 for NSTEMI and hypertensive urgency. Peak troponin 0.08. He underwent LHC on 03/03/14 which revealed  1. Severe double vessel CAD 2. Unstable angina (class 4), NSTEMI 3. Moderate LV systolic dysfunction He was felt not to be a good candidate for CABG due to his inability to walk s/p CVA. Additionally, due to his inability to swallow thin liquids, he was not able to be safely loaded with plavix. This was crushed in applesauce and given to him later that night. He was hydrated post cath with planned PCI of the LAD and RCA the following day. He then underwent successful PCI on 03/04/14 on long segments of both the mid RCA and mid LAD. There still residual distal disease more notable in the RCA then the LAD, however the distal RCA is not approachable by PCI due to the sizable vessel.   [name] was [facility] to [facility] and was brought in by ambulance by stretcher due to inability to walk, although he can  ride in a wheelchair. EMS personal did not know why he was in a stretcher. He is feeling fine today and has had no chest pain. He does get some non specific SOB from time to time that is not related to laying flat. He denies orthopnea, PND or LE edema. No dizziness or blood in stool or urine. He is feeling much better since discharge.     Past Medical History  Diagnosis Date  . Stroke     a. wheelchair bound and swallowing difficulties  . Hypertension   . Diabetes mellitus without complication   . Ischemic cardiomyopathy     a. 03/01/14: 2D ECHO w/ LVEF 35-40%, mod LVH, inferosept/inf hypokinesis,  . CAD (coronary artery disease)     a. 03/04/14 [place] s/p DES to [facility]AD and [facility]  . Aortic stenosis     a. 03/01/14 ECHO moderate with AVA 1.1, Pk grad 32.  Marland Kitchen. HLD (hyperlipidemia)     Past Surgical History  Procedure Laterality Date  . Left heart catheterization with coronary angiogram N/A 03/03/2014    Procedure: LEFT HEART CATHETERIZATION WITH CORONARY ANGIOGRAM;  Surgeon: Kathleene Hazelhristopher D McAlhany, MD;  [facility];  Service: Cardiovascular;  Laterality: N/A;  . Percutaneous coronary stent intervention (pci-s) N/A 03/04/2014    Procedure: PERCUTANEOUS CORONARY STENT INTERVENTION (PCI-S);  Surgeon: Marykay Lexavid W Harding, MD;  Location[facility];  Service: Cardiovascular;  Laterality: N/A;     Current Outpatient Prescriptions  Medication Sig Dispense  Refill  . allopurinol (ZYLOPRIM) 300 MG tablet Take 300 mg by mouth daily.    Marland Kitchen aspirin EC 81 MG tablet Take 81 mg by mouth daily.    Marland Kitchen atorvastatin (LIPITOR) 80 MG tablet Take 1 tablet (80 mg total) by mouth daily at 6 PM. 30 tablet 11  . benzonatate (TESSALON) 100 MG capsule Take by mouth 2 (two) times daily as needed for cough.    . carvedilol (COREG) 6.25 MG tablet Take 1 tablet (6.25 mg total) by mouth 2 (two) times daily with a meal. 60 tablet 11  . clopidogrel (PLAVIX) 75 MG tablet Take 1 tablet (75 mg total) by mouth daily. 30  tablet 11  . enalapril (VASOTEC) 20 MG tablet Take 1 tablet (20 mg total) by mouth 2 (two) times daily. 60 tablet 11  . hydrALAZINE (APRESOLINE) 25 MG tablet Take 0.5 tablets (12.5 mg total) by mouth every 8 (eight) hours. 90 tablet 11  . isosorbide mononitrate (IMDUR) 30 MG 24 hr tablet Take 1 tablet (30 mg total) by mouth daily. 30 tablet 11  . nitroGLYCERIN (NITROSTAT) 0.4 MG SL tablet Place 1 tablet (0.4 mg total) under the tongue every 5 (five) minutes x 3 doses as needed for chest pain. 25 tablet 12  . propantheline (PROBANTHINE) 15 MG tablet Take 15 mg by mouth 3 (three) times daily with meals.    . sertraline (ZOLOFT) 100 MG tablet Take 100 mg by mouth daily.     No current facility-administered medications for this visit.    Allergies:   Review of patient's allergies indicates no known allergies.    Social History:  The patient  reports that he has never smoked. He does not have any smokeless tobacco history on file. He reports that he does not drink alcohol.    ROS:  Please see the history of present illness.   All other systems are reviewed and negative.    PHYSICAL EXAM: VS:  BP 132/78 mmHg  Pulse 65  Ht  (1.727 m) , BMI There is no weight on file to calculate BMI. GEN: Well nourished, well developed, in no acute distress. Risk analyst HEENT: normal Neck: no JVD, carotid bruits, or masses Cardiac: RRR; no murmurs, rubs, or gallops,no edema  Respiratory:  clear to auscultation bilaterally, normal work of breathing GI: soft, nontender, nondistended, + BS MS: no deformity or atrophy Skin: warm and dry, no rash Neuro: Wheelchair bound Psych: euthymic mood, full affect   EKG:  EKG is not ordered today.   Recent Labs: 02/28/2014: ALT 17 03/05/2014: BUN 14; Creatinine 0.75; Hemoglobin 10.1*; Platelets 200; Potassium 3.6; Sodium 139    Lipid Panel    Component Value Date/Time   CHOL 205* 03/01/2014 0236   TRIG 150* 03/01/2014 0236   HDL 25* 03/01/2014 0236     CHOLHDL 8.2 03/01/2014 0236   VLDL 30 03/01/2014 0236   LDLCALC 150* 03/01/2014 0236      Wt Readings from Last 3 Encounters:  03/05/14 158 lb 15.2 oz (72.1 kg)      Other studies Reviewed: Additional studies/ records that were reviewed today include: LHC Review of the above records demonstrates: Severe 2V CAD treated with DES on long segments of both the mid RCA and mid LAD   ASSESSMENT AND PLAN:  Mr. Ducey is a 17M with CAD s/p MI without revascularization, PVD s/p RLE revascularization, HTN, and prior CVA with residual L sided hemiparesis who presented to [facility] on 02/28/14 with NSTEMI and hypertensive emergency.  He presents today from his NH for post hospital follow up.   CAD/NSTEMI- s/p successful PCI on 03/04/14 on long segments of both the mid RCA and mid LAD -- No chest pain. Some mild non specific SOB -- Continue DAPT with ASA/plavix for lifetime. Continue atorvastatin 80 mg, enalapril 20 BID, and coreg 6.25mg  BID as well as imdur  -- Radial and groin site stable   Moderate aortic stenosis: He has no HF symptoms -- 2D ECHO with Trileaflet. There was no regurgitation. Mean gradient (S): 19 mm Hg. Peak gradient (S): 32 mm Hg. Valve area (VTI): 1.1 cm^2. Valve area (Vmax): 1.16 cm^2. Valve area (Vmean): 1.17 cm^2.  Hyperlipidemia - LDL 150, TG 150, low HDL, continue high dose of atorvastatin, repeat lipids at next visit and if still elevated he might be a candidate for PCSK 9 inhibitors  Hypertension - Well controlled 132/78 today. Continue Coreg 6.25mg , enalapril  BID and hydralazine 12.5mg  q8hrs   Dilated CM with EF 35-40% ? Secondary to CAD vs. HTN - continue ACE I and BB -- No s/s volume overload.   Current medicines are reviewed at length with the patient today.  The patient does not have concerns regarding medicines.  The following changes have been made:  no change  Labs/ tests ordered today include: None  No orders of the defined types were placed in  this encounter.     Disposition:   FU with Dr. Delton See in 4-6 months with a spanish interpreter.    Charlestine Massed  03/19/2014 9:23 AM    [facility] [address] [place], [place], [place]  [address] Phone: [phone]; Fax: [phone]

## 2014-03-22 LAB — TSH: TSH: 4.7 u[IU]/mL (ref <=5.90)

## 2014-04-20 ENCOUNTER — Non-Acute Institutional Stay (SKILLED_NURSING_FACILITY): Payer: Medicare Other | Admitting: Internal Medicine

## 2014-04-20 DIAGNOSIS — I6930 Unspecified sequelae of cerebral infarction: Secondary | ICD-10-CM | POA: Diagnosis not present

## 2014-04-20 DIAGNOSIS — M6249 Contracture of muscle, multiple sites: Secondary | ICD-10-CM

## 2014-04-20 DIAGNOSIS — I10 Essential (primary) hypertension: Secondary | ICD-10-CM | POA: Diagnosis not present

## 2014-04-20 DIAGNOSIS — E119 Type 2 diabetes mellitus without complications: Secondary | ICD-10-CM

## 2014-04-20 DIAGNOSIS — I255 Ischemic cardiomyopathy: Secondary | ICD-10-CM

## 2014-04-20 DIAGNOSIS — M62838 Other muscle spasm: Secondary | ICD-10-CM

## 2014-04-20 DIAGNOSIS — I25110 Atherosclerotic heart disease of native coronary artery with unstable angina pectoris: Secondary | ICD-10-CM | POA: Diagnosis not present

## 2014-04-20 DIAGNOSIS — I350 Nonrheumatic aortic (valve) stenosis: Secondary | ICD-10-CM | POA: Diagnosis not present

## 2014-04-23 ENCOUNTER — Encounter: Payer: Self-pay | Admitting: Internal Medicine

## 2014-04-23 NOTE — Progress Notes (Signed)
Patient ID: Dylan Malone, male   DOB: [birth year], 69 y.o.   MRN: [id number]    Facility  [facility]   Place of Service:   SNF  No Known Allergies  Chief Complaint  Patient presents with  . Medical Management of Chronic Issues    HPI:  69 yo male long term resident seen today for f/u. He c/o dizziness with head position changes and sore throat. No cough, CP, SOB. No f/c.  He has LUE and LLE pain and PT reports spasms in contracted extremities that makes PT difficult. 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 BP has been labile and was 90/55 during  PT and he had to stop and rest.  DM is stable. No low BS reactions.  He will see cardio later this summer to f/u on ischemic cardiomyopathy with EF 35-40% and CAD with hx MI. No cardiac sx's at this time.  He is eating well and no issues with sleeping.   CODE STATUS: Full; MOST form in chart  Past Medical History  Diagnosis Date  . Stroke     a. wheelchair bound and swallowing difficulties  . Hypertension   . Diabetes mellitus without complication   . Ischemic cardiomyopathy     a. 03/01/14: 2D ECHO w/ LVEF 35-40%, mod LVH, inferosept/inf hypokinesis,  . CAD (coronary artery disease)     a. 03/04/14 [place] s/p DES to mLAD and DES to [facility]  . Aortic stenosis     a. 03/01/14 ECHO moderate with AVA 1.1, Pk grad 32.  Marland Kitchen HLD (hyperlipidemia)    Past Surgical History  Procedure Laterality Date  . Left heart catheterization with coronary angiogram N/A 03/03/2014    Procedure: LEFT HEART CATHETERIZATION WITH CORONARY ANGIOGRAM;  Surgeon: Burnell Blanks, MD;  Location: [facility] CATH LAB;  Service: Cardiovascular;  Laterality: N/A;  . Percutaneous coronary stent intervention (pci-s) N/A 03/04/2014    Procedure: PERCUTANEOUS CORONARY STENT INTERVENTION (PCI-S);  Surgeon: Leonie Man, MD;  Location: [facility] CATH LAB;  Service: Cardiovascular;  Laterality: N/A;   History   Social History  . Marital Status: Divorced    Spouse Name: N/A    . Number of Children: N/A  . Years of Education: N/A   Social History Main Topics  . Smoking status: Never Smoker   . Smokeless tobacco: Not on file  . Alcohol Use: No  . Drug Use: Not on file  . Sexual Activity: Not on file   Other Topics Concern  . None   Social History Narrative    Medications: Patient's Medications  New Prescriptions   No medications on file  Previous Medications   ALLOPURINOL (ZYLOPRIM) 300 MG TABLET    Take 300 mg by mouth daily.   ASPIRIN EC 81 MG TABLET    Take 81 mg by mouth daily.   ATORVASTATIN (LIPITOR) 80 MG TABLET    Take 1 tablet (80 mg total) by mouth daily at 6 PM.   BENZONATATE (TESSALON) 100 MG CAPSULE    Take by mouth 2 (two) times daily as needed for cough.   CARVEDILOL (COREG) 6.25 MG TABLET    Take 1 tablet (6.25 mg total) by mouth 2 (two) times daily with a meal.   CLOPIDOGREL (PLAVIX) 75 MG TABLET    Take 1 tablet (75 mg total) by mouth daily.   ENALAPRIL (VASOTEC) 20 MG TABLET    Take 1 tablet (20 mg total) by mouth 2 (two) times daily.   HYDRALAZINE (APRESOLINE) 25  MG TABLET    Take 0.5 tablets (12.5 mg total) by mouth every 8 (eight) hours.   ISOSORBIDE MONONITRATE (IMDUR) 30 MG 24 HR TABLET    Take 1 tablet (30 mg total) by mouth daily.   NITROGLYCERIN (NITROSTAT) 0.4 MG SL TABLET    Place 1 tablet (0.4 mg total) under the tongue every 5 (five) minutes x 3 doses as needed for chest pain.   PROPANTHELINE (PROBANTHINE) 15 MG TABLET    Take 15 mg by mouth 3 (three) times daily with meals.   SERTRALINE (ZOLOFT) 100 MG TABLET    Take 100 mg by mouth daily.  Modified Medications   No medications on file  Discontinued Medications   No medications on file     Review of Systems  Constitutional: Negative for chills, activity change and fatigue.  HENT: Positive for sore throat. Negative for trouble swallowing.   Eyes: Negative for visual disturbance.  Respiratory: Negative for cough, chest tightness and shortness of breath.    Cardiovascular: Negative for chest pain, palpitations and leg swelling.  Gastrointestinal: Negative for nausea, vomiting, abdominal pain and blood in stool.  Genitourinary: Negative for urgency, frequency and difficulty urinating.  Musculoskeletal: Positive for gait problem. Negative for arthralgias.  Skin: Negative for rash.  Neurological: Positive for dizziness and weakness. Negative for seizures and headaches.  Psychiatric/Behavioral: Negative for confusion and sleep disturbance. The patient is not nervous/anxious.     Filed Vitals:   04/03/14 2229  BP: 110/65  Pulse: 68  Temp: 98.1 F (36.7 C)  SpO2: 98%   There is no weight on file to calculate BMI.  Physical Exam  Constitutional: He is oriented to person, place, and time. He appears well-developed and well-nourished.  HENT:  Mouth/Throat: Oropharynx is clear and moist.  Eyes: Pupils are equal, round, and reactive to light. No scleral icterus.  Neck: Neck supple.  Cardiovascular: Normal rate, regular rhythm and intact distal pulses.  Exam reveals no gallop and no friction rub.   Murmur heard.  Systolic (RADIATING INTO CAROTID B/L) murmur is present with a grade of 2/6  Pulses:      Carotid pulses are on the right side with bruit, and on the left side with bruit.  no distal LE swelling  Pulmonary/Chest: Effort normal and breath sounds normal. He has no wheezes. He has no rales. He exhibits no tenderness.  Abdominal: Soft. Bowel sounds are normal. He exhibits no distension, no abdominal bruit, no pulsatile midline mass and no mass. There is no tenderness. There is no rebound and no guarding.    Musculoskeletal:  LUE contracture  Lymphadenopathy:    He has no cervical adenopathy.  Neurological: He is alert and oriented to person, place, and time.  Reduced LLE strength  Skin: Skin is warm and dry. No rash noted.  Psychiatric: He has a normal mood and affect. His behavior is normal. Judgment and thought content normal.      Labs reviewed: Admission on 02/28/2014, Discharged on 03/05/2014  Component Date Value Ref Range Status  . WBC 02/28/2014 10.4  4.0 - 10.5 K/uL Final  . RBC 02/28/2014 4.66  4.22 - 5.81 MIL/uL Final  . Hemoglobin 02/28/2014 13.0  13.0 - 17.0 g/dL Final  . HCT 02/28/2014 39.5  39.0 - 52.0 % Final  . MCV 02/28/2014 84.8  78.0 - 100.0 fL Final  . MCH 02/28/2014 27.9  26.0 - 34.0 pg Final  . MCHC 02/28/2014 32.9  30.0 - 36.0 g/dL Final  . RDW  02/28/2014 15.7* 11.5 - 15.5 % Final  . Platelets 02/28/2014 223  150 - 400 K/uL Final  . Sodium 02/28/2014 139  135 - 145 mmol/L Final   Please note change in reference range.  . Potassium 02/28/2014 4.0  3.5 - 5.1 mmol/L Final   Please note change in reference range.  . Chloride 02/28/2014 102  96 - 112 mEq/L Final  . CO2 02/28/2014 31  19 - 32 mmol/L Final  . Glucose, Bld 02/28/2014 99  70 - 99 mg/dL Final  . BUN 02/28/2014 16  6 - 23 mg/dL Final  . Creatinine, Ser 02/28/2014 0.64  0.50 - 1.35 mg/dL Final  . Calcium 02/28/2014 9.7  8.4 - 10.5 mg/dL Final  . Total Protein 02/28/2014 9.1* 6.0 - 8.3 g/dL Final  . Albumin 02/28/2014 4.7  3.5 - 5.2 g/dL Final  . AST 02/28/2014 24  0 - 37 U/L Final  . ALT 02/28/2014 17  0 - 53 U/L Final  . Alkaline Phosphatase 02/28/2014 116  39 - 117 U/L Final  . Total Bilirubin 02/28/2014 0.8  0.3 - 1.2 mg/dL Final  . GFR calc non Af Amer 02/28/2014 >90  >90 mL/min Final  . GFR calc Af Amer 02/28/2014 >90  >90 mL/min Final   Comment: (NOTE) The eGFR has been calculated using the CKD EPI equation. This calculation has not been validated in all clinical situations. eGFR's persistently <90 mL/min signify possible Chronic Kidney Disease.   . Anion gap 02/28/2014 6  5 - 15 Final  . Troponin I 02/28/2014 0.08* <0.031 ng/mL Final   Comment:        PERSISTENTLY INCREASED TROPONIN VALUES IN THE RANGE OF 0.04-0.49 ng/mL CAN BE SEEN IN:       -UNSTABLE ANGINA       -CONGESTIVE HEART FAILURE        -MYOCARDITIS       -CHEST TRAUMA       -ARRYHTHMIAS       -LATE PRESENTING MYOCARDIAL INFARCTION       -COPD   CLINICAL FOLLOW-UP RECOMMENDED. Please note change in reference range.   . Heparin Unfractionated 03/01/2014 0.23* 0.30 - 0.70 IU/mL Final   Comment:        IF HEPARIN RESULTS ARE BELOW EXPECTED VALUES, AND PATIENT DOSAGE HAS BEEN CONFIRMED, SUGGEST FOLLOW UP TESTING OF ANTITHROMBIN III LEVELS.   . WBC 03/01/2014 7.8  4.0 - 10.5 K/uL Final  . RBC 03/01/2014 4.03* 4.22 - 5.81 MIL/uL Final  . Hemoglobin 03/01/2014 11.1* 13.0 - 17.0 g/dL Final  . HCT 03/01/2014 33.3* 39.0 - 52.0 % Final  . MCV 03/01/2014 82.6  78.0 - 100.0 fL Final  . MCH 03/01/2014 27.5  26.0 - 34.0 pg Final  . MCHC 03/01/2014 33.3  30.0 - 36.0 g/dL Final  . RDW 03/01/2014 15.8* 11.5 - 15.5 % Final  . Platelets 03/01/2014 201  150 - 400 K/uL Final  . Troponin I 02/28/2014 0.06* <0.031 ng/mL Final   Comment:        PERSISTENTLY INCREASED TROPONIN VALUES IN THE RANGE OF 0.04-0.49 ng/mL CAN BE SEEN IN:       -UNSTABLE ANGINA       -CONGESTIVE HEART FAILURE       -MYOCARDITIS       -CHEST TRAUMA       -ARRYHTHMIAS       -LATE PRESENTING MYOCARDIAL INFARCTION       -COPD   CLINICAL FOLLOW-UP RECOMMENDED. Please note change in  reference range.   . Troponin I 03/01/2014 0.06* <0.031 ng/mL Final   Comment:        PERSISTENTLY INCREASED TROPONIN VALUES IN THE RANGE OF 0.04-0.49 ng/mL CAN BE SEEN IN:       -UNSTABLE ANGINA       -CONGESTIVE HEART FAILURE       -MYOCARDITIS       -CHEST TRAUMA       -ARRYHTHMIAS       -LATE PRESENTING MYOCARDIAL INFARCTION       -COPD   CLINICAL FOLLOW-UP RECOMMENDED. Please note change in reference range.   . Hgb A1c MFr Bld 02/28/2014 5.4  <5.7 % Final   Comment: (NOTE)                                                                       According to the ADA Clinical Practice Recommendations for 2011, when HbA1c is used as a screening test:  >=6.5%    Diagnostic of Diabetes Mellitus           (if abnormal result is confirmed) 5.7-6.4%   Increased risk of developing Diabetes Mellitus References:Diagnosis and Classification of Diabetes Mellitus,Diabetes ZSMO,7078,67(JQGBE 1):S62-S69 and Standards of Medical Care in         Diabetes - 2011,Diabetes EFEO,7121,97 (Suppl 1):S11-S61.   . Mean Plasma Glucose 02/28/2014 108  <117 mg/dL Final   Performed at [facility]  . Sodium 03/01/2014 139  135 - 145 mmol/L Final   Please note change in reference range.  . Potassium 03/01/2014 3.5  3.5 - 5.1 mmol/L Final   Please note change in reference range.  . Chloride 03/01/2014 103  96 - 112 mEq/L Final  . CO2 03/01/2014 25  19 - 32 mmol/L Final  . Glucose, Bld 03/01/2014 110* 70 - 99 mg/dL Final  . BUN 03/01/2014 12  6 - 23 mg/dL Final  . Creatinine, Ser 03/01/2014 0.69  0.50 - 1.35 mg/dL Final  . Calcium 03/01/2014 9.3  8.4 - 10.5 mg/dL Final  . GFR calc non Af Amer 03/01/2014 >90  >90 mL/min Final  . GFR calc Af Amer 03/01/2014 >90  >90 mL/min Final   Comment: (NOTE) The eGFR has been calculated using the CKD EPI equation. This calculation has not been validated in all clinical situations. eGFR's persistently <90 mL/min signify possible Chronic Kidney Disease.   . Anion gap 03/01/2014 11  5 - 15 Final  . Total CK 02/28/2014 46  7 - 232 U/L Final  . CK, MB 02/28/2014 1.7  0.3 - 4.0 ng/mL Final  . Relative Index 02/28/2014 RELATIVE INDEX IS INVALID  0.0 - 2.5 Final   Comment: WHEN CK < 100 U/L          . MRSA by PCR 02/28/2014 POSITIVE* NEGATIVE Final   Comment:        The GeneXpert MRSA Assay (FDA approved for NASAL specimens only), is one component of a comprehensive MRSA colonization surveillance program. It is not intended to diagnose MRSA infection nor to guide or monitor treatment for MRSA infections. RESULT CALLED TO, READ BACK BY AND VERIFIED WITH: HILDA MOOSE,RN 03/01/2014 [place]   . Cholesterol 03/01/2014  205* 0 - 200 mg/dL Final  .  Triglycerides 03/01/2014 150* <150 mg/dL Final  . HDL 03/01/2014 25* >39 mg/dL Final  . Total CHOL/HDL Ratio 03/01/2014 8.2   Final  . VLDL 03/01/2014 30  0 - 40 mg/dL Final  . LDL Cholesterol 03/01/2014 150* 0 - 99 mg/dL Final   Comment:        Total Cholesterol/HDL:CHD Risk Coronary Heart Disease Risk Table                     Men   Women  1/2 Average Risk   3.4   3.3  Average Risk       5.0   4.4  2 X Average Risk   9.6   7.1  3 X Average Risk  23.4   11.0        Use the calculated Patient Ratio above and the CHD Risk Table to determine the patient's CHD Risk.        ATP III CLASSIFICATION (LDL):  <100     mg/dL   Optimal  100-129  mg/dL   Near or Above                    Optimal  130-159  mg/dL   Borderline  160-189  mg/dL   High  >190     mg/dL   Very High   . Heparin Unfractionated 03/01/2014 0.50  0.30 - 0.70 IU/mL Final   Comment:        IF HEPARIN RESULTS ARE BELOW EXPECTED VALUES, AND PATIENT DOSAGE HAS BEEN CONFIRMED, SUGGEST FOLLOW UP TESTING OF ANTITHROMBIN III LEVELS.   Marland Kitchen Heparin Unfractionated 03/01/2014 0.40  0.30 - 0.70 IU/mL Final   Comment:        IF HEPARIN RESULTS ARE BELOW EXPECTED VALUES, AND PATIENT DOSAGE HAS BEEN CONFIRMED, SUGGEST FOLLOW UP TESTING OF ANTITHROMBIN III LEVELS.   Marland Kitchen Heparin Unfractionated 03/02/2014 0.41  0.30 - 0.70 IU/mL Final   Comment:        IF HEPARIN RESULTS ARE BELOW EXPECTED VALUES, AND PATIENT DOSAGE HAS BEEN CONFIRMED, SUGGEST FOLLOW UP TESTING OF ANTITHROMBIN III LEVELS.   . WBC 03/02/2014 8.4  4.0 - 10.5 K/uL Final  . RBC 03/02/2014 4.13* 4.22 - 5.81 MIL/uL Final  . Hemoglobin 03/02/2014 11.2* 13.0 - 17.0 g/dL Final  . HCT 03/02/2014 34.2* 39.0 - 52.0 % Final  . MCV 03/02/2014 82.8  78.0 - 100.0 fL Final  . MCH 03/02/2014 27.1  26.0 - 34.0 pg Final  . MCHC 03/02/2014 32.7  30.0 - 36.0 g/dL Final  . RDW 03/02/2014 15.9* 11.5 - 15.5 % Final  . Platelets 03/02/2014 193  150 - 400  K/uL Final  . Heparin Unfractionated 03/03/2014 0.34  0.30 - 0.70 IU/mL Final   Comment:        IF HEPARIN RESULTS ARE BELOW EXPECTED VALUES, AND PATIENT DOSAGE HAS BEEN CONFIRMED, SUGGEST FOLLOW UP TESTING OF ANTITHROMBIN III LEVELS.   . WBC 03/03/2014 10.1  4.0 - 10.5 K/uL Final  . RBC 03/03/2014 4.42  4.22 - 5.81 MIL/uL Final  . Hemoglobin 03/03/2014 12.5* 13.0 - 17.0 g/dL Final  . HCT 03/03/2014 37.9* 39.0 - 52.0 % Final  . MCV 03/03/2014 85.7  78.0 - 100.0 fL Final  . MCH 03/03/2014 28.3  26.0 - 34.0 pg Final  . MCHC 03/03/2014 33.0  30.0 - 36.0 g/dL Final  . RDW 03/03/2014 15.9* 11.5 - 15.5 % Final  . Platelets 03/03/2014 245  150 - 400 K/uL Final  .  Prothrombin Time 03/03/2014 13.4  11.6 - 15.2 seconds Final  . INR 03/03/2014 1.01  0.00 - 1.49 Final  . WBC 03/04/2014 9.8  4.0 - 10.5 K/uL Final  . RBC 03/04/2014 4.14* 4.22 - 5.81 MIL/uL Final  . Hemoglobin 03/04/2014 11.2* 13.0 - 17.0 g/dL Final  . HCT 03/04/2014 34.4* 39.0 - 52.0 % Final  . MCV 03/04/2014 83.1  78.0 - 100.0 fL Final  . MCH 03/04/2014 27.1  26.0 - 34.0 pg Final  . MCHC 03/04/2014 32.6  30.0 - 36.0 g/dL Final  . RDW 03/04/2014 16.0* 11.5 - 15.5 % Final  . Platelets 03/04/2014 240  150 - 400 K/uL Final  . Activated Clotting Time 03/03/2014 140   Final  . Activated Clotting Time 03/03/2014 190   Final  . Sodium 03/04/2014 138  135 - 145 mmol/L Final   Please note change in reference range.  . Potassium 03/04/2014 3.6  3.5 - 5.1 mmol/L Final   Please note change in reference range.  . Chloride 03/04/2014 107  96 - 112 mEq/L Final  . CO2 03/04/2014 22  19 - 32 mmol/L Final  . Glucose, Bld 03/04/2014 111* 70 - 99 mg/dL Final  . BUN 03/04/2014 12  6 - 23 mg/dL Final  . Creatinine, Ser 03/04/2014 0.60  0.50 - 1.35 mg/dL Final  . Calcium 03/04/2014 9.6  8.4 - 10.5 mg/dL Final  . GFR calc non Af Amer 03/04/2014 >90  >90 mL/min Final  . GFR calc Af Amer 03/04/2014 >90  >90 mL/min Final   Comment: (NOTE) The  eGFR has been calculated using the CKD EPI equation. This calculation has not been validated in all clinical situations. eGFR's persistently <90 mL/min signify possible Chronic Kidney Disease.   . Anion gap 03/04/2014 9  5 - 15 Final  . WBC 03/04/2014 8.7  4.0 - 10.5 K/uL Final  . RBC 03/04/2014 3.89* 4.22 - 5.81 MIL/uL Final  . Hemoglobin 03/04/2014 11.2* 13.0 - 17.0 g/dL Final  . HCT 03/04/2014 33.2* 39.0 - 52.0 % Final  . MCV 03/04/2014 85.3  78.0 - 100.0 fL Final  . MCH 03/04/2014 28.8  26.0 - 34.0 pg Final  . MCHC 03/04/2014 33.7  30.0 - 36.0 g/dL Final  . RDW 03/04/2014 16.1* 11.5 - 15.5 % Final  . Platelets 03/04/2014 198  150 - 400 K/uL Final  . Creatinine, Ser 03/04/2014 0.67  0.50 - 1.35 mg/dL Final  . GFR calc non Af Amer 03/04/2014 >90  >90 mL/min Final  . GFR calc Af Amer 03/04/2014 >90  >90 mL/min Final   Comment: (NOTE) The eGFR has been calculated using the CKD EPI equation. This calculation has not been validated in all clinical situations. eGFR's persistently <90 mL/min signify possible Chronic Kidney Disease.   . Glucose-Capillary 03/04/2014 104* 70 - 99 mg/dL Final  . WBC 03/05/2014 8.9  4.0 - 10.5 K/uL Final  . RBC 03/05/2014 3.63* 4.22 - 5.81 MIL/uL Final  . Hemoglobin 03/05/2014 10.1* 13.0 - 17.0 g/dL Final  . HCT 03/05/2014 30.4* 39.0 - 52.0 % Final  . MCV 03/05/2014 83.7  78.0 - 100.0 fL Final  . MCH 03/05/2014 27.8  26.0 - 34.0 pg Final  . MCHC 03/05/2014 33.2  30.0 - 36.0 g/dL Final  . RDW 03/05/2014 16.1* 11.5 - 15.5 % Final  . Platelets 03/05/2014 200  150 - 400 K/uL Final  . Sodium 03/05/2014 139  135 - 145 mmol/L Final   Please note change in reference  range.  . Potassium 03/05/2014 3.6  3.5 - 5.1 mmol/L Final   Please note change in reference range.  . Chloride 03/05/2014 108  96 - 112 mEq/L Final  . CO2 03/05/2014 25  19 - 32 mmol/L Final  . Glucose, Bld 03/05/2014 98  70 - 99 mg/dL Final  . BUN 03/05/2014 14  6 - 23 mg/dL Final  .  Creatinine, Ser 03/05/2014 0.75  0.50 - 1.35 mg/dL Final  . Calcium 03/05/2014 9.0  8.4 - 10.5 mg/dL Final  . GFR calc non Af Amer 03/05/2014 >90  >90 mL/min Final  . GFR calc Af Amer 03/05/2014 >90  >90 mL/min Final   Comment: (NOTE) The eGFR has been calculated using the CKD EPI equation. This calculation has not been validated in all clinical situations. eGFR's persistently <90 mL/min signify possible Chronic Kidney Disease.   . Anion gap 03/05/2014 6  5 - 15 Final  . Activated Clotting Time 03/04/2014 595   Final  . Glucose-Capillary 03/04/2014 112* 70 - 99 mg/dL Final  . Comment 1 03/04/2014 Notify RN   Final  . Glucose-Capillary 03/04/2014 79  70 - 99 mg/dL Final  . Comment 1 03/04/2014 Notify RN   Final  . Comment 2 03/04/2014 Documented in Chart   Final  . Glucose-Capillary 03/05/2014 103* 70 - 99 mg/dL Final  . Comment 1 03/05/2014 Notify RN   Final  . Comment 2 03/05/2014 Documented in Chart   Final     Assessment/Plan   ICD-9-CM ICD-10-CM   1. 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 Essential hypertension - controlled 401.9 I10   3. History of CVA with residual deficit - stable on plavix 438.9 I69.30   4. Diabetes mellitus without complication - diet controlled 250.00 E11.9   5. Aortic stenosis - aysmptomatic 424.1 I35.0   6. Ischemic cardiomyopathy 414.8 I25.5   7. Coronary artery disease involving native coronary artery of native heart with unstable angina pectoris - asymptomatic 414.01 I25.110    411.1     --start robaxin 526m BID for spasticity to improve functional status  --cardiac is stable with hydralazine, imdur, enalapril, coreg  --continue statin for cholesterol  --mood is stable on sertraline  --no gout attacks on allopurinol  --continue PT/OT/ST as indicated  --continue CBGs fasting daily  --will follow   Deania Siguenza S. CPerlie Gold PDallas [facility]and [facility] [address]GWynnedale Buhler [phone] (Wednesdays and Fridays 8 AM - 5 PM) [phone] (Monday-Friday 8 AM - 5 PM)

## 2014-05-08 ENCOUNTER — Non-Acute Institutional Stay (SKILLED_NURSING_FACILITY): Payer: Medicare Other | Admitting: Adult Health

## 2014-05-08 DIAGNOSIS — I6930 Unspecified sequelae of cerebral infarction: Secondary | ICD-10-CM | POA: Diagnosis not present

## 2014-05-08 DIAGNOSIS — F329 Major depressive disorder, single episode, unspecified: Secondary | ICD-10-CM | POA: Diagnosis not present

## 2014-05-08 DIAGNOSIS — I10 Essential (primary) hypertension: Secondary | ICD-10-CM | POA: Diagnosis not present

## 2014-05-08 DIAGNOSIS — I25110 Atherosclerotic heart disease of native coronary artery with unstable angina pectoris: Secondary | ICD-10-CM

## 2014-05-08 DIAGNOSIS — F32A Depression, unspecified: Secondary | ICD-10-CM

## 2014-05-08 DIAGNOSIS — E559 Vitamin D deficiency, unspecified: Secondary | ICD-10-CM

## 2014-05-08 DIAGNOSIS — I639 Cerebral infarction, unspecified: Secondary | ICD-10-CM | POA: Diagnosis not present

## 2014-05-08 DIAGNOSIS — M1A9XX0 Chronic gout, unspecified, without tophus (tophi): Secondary | ICD-10-CM

## 2014-05-08 DIAGNOSIS — E785 Hyperlipidemia, unspecified: Secondary | ICD-10-CM | POA: Diagnosis not present

## 2014-05-21 ENCOUNTER — Encounter: Payer: Self-pay | Admitting: Internal Medicine

## 2014-05-21 ENCOUNTER — Non-Acute Institutional Stay (SKILLED_NURSING_FACILITY): Payer: Medicare Other | Admitting: Internal Medicine

## 2014-05-21 DIAGNOSIS — B029 Zoster without complications: Secondary | ICD-10-CM | POA: Diagnosis not present

## 2014-05-21 DIAGNOSIS — L259 Unspecified contact dermatitis, unspecified cause: Secondary | ICD-10-CM | POA: Diagnosis not present

## 2014-05-21 DIAGNOSIS — I10 Essential (primary) hypertension: Secondary | ICD-10-CM | POA: Diagnosis not present

## 2014-05-21 DIAGNOSIS — E119 Type 2 diabetes mellitus without complications: Secondary | ICD-10-CM | POA: Diagnosis not present

## 2014-05-21 NOTE — Progress Notes (Signed)
Patient ID: Dylan Malone, male   DOB: [birth year], 69 y.o.   MRN: [id number]    Facility  [facility]    Place of Service:   SNF   No Known Allergies  Chief Complaint  Patient presents with  . Acute Visit    rash    HPI:  69 yo male seen today for itchy rash on his leg x 1 day. (+) pain and skin crawling sensation. He also notes a rash on right palm and left posterior arm. He has generalized itching. No f/c, HA or new dizziness. No nausea. Appetite is okay and he sleeps well overall. No recent URI. No nursing issues  BP controlled on coreg, enalapril, and hydralazine. He has ischemic cardiomyopathy and takes isosorbide daily. CBG 98 today. He takes no meds for diabetes.   CODE STATUS: Full    Past Medical History  Diagnosis Date  . Stroke     a. wheelchair bound and swallowing difficulties  . Hypertension   . Diabetes mellitus without complication   . Ischemic cardiomyopathy     a. 03/01/14: 2D ECHO w/ LVEF 35-40%, mod LVH, inferosept/inf hypokinesis,  . CAD (coronary artery disease)     a. 03/04/14 [place] s/p DES to mLAD and DES to [facility]  . Aortic stenosis     a. 03/01/14 ECHO moderate with AVA 1.1, Pk grad 32.  Marland Kitchen HLD (hyperlipidemia)    Past Surgical History  Procedure Laterality Date  . Left heart catheterization with coronary angiogram N/A 03/03/2014    Procedure: LEFT HEART CATHETERIZATION WITH CORONARY ANGIOGRAM;  Surgeon: Burnell Blanks, MD;  Location: [facility] CATH LAB;  Service: Cardiovascular;  Laterality: N/A;  . Percutaneous coronary stent intervention (pci-s) N/A 03/04/2014    Procedure: PERCUTANEOUS CORONARY STENT INTERVENTION (PCI-S);  Surgeon: Leonie Man, MD;  Location: [facility] CATH LAB;  Service: Cardiovascular;  Laterality: N/A;   History   Social History  . Marital Status: Divorced    Spouse Name: N/A  . Number of Children: N/A  . Years of Education: N/A   Social History Main Topics  . Smoking status: Never Smoker   . Smokeless tobacco: Not  on file  . Alcohol Use: No  . Drug Use: Not on file  . Sexual Activity: Not on file   Other Topics Concern  . None   Social History Narrative     Medications: Patient's Medications  New Prescriptions   No medications on file  Previous Medications   ALLOPURINOL (ZYLOPRIM) 300 MG TABLET    Take 300 mg by mouth daily.   ASPIRIN EC 81 MG TABLET    Take 81 mg by mouth daily.   ATORVASTATIN (LIPITOR) 80 MG TABLET    Take 1 tablet (80 mg total) by mouth daily at 6 PM.   BACLOFEN (LIORESAL) 10 MG TABLET    Take 5 mg by mouth 3 (three) times daily.   BENZONATATE (TESSALON) 100 MG CAPSULE    Take by mouth 2 (two) times daily as needed for cough.   CARVEDILOL (COREG) 6.25 MG TABLET    Take 1 tablet (6.25 mg total) by mouth 2 (two) times daily with a meal.   CLOPIDOGREL (PLAVIX) 75 MG TABLET    Take 1 tablet (75 mg total) by mouth daily.   ENALAPRIL (VASOTEC) 20 MG TABLET    Take 1 tablet (20 mg total) by mouth 2 (two) times daily.   HYDRALAZINE (APRESOLINE) 25 MG TABLET    Take 0.5 tablets (12.5 mg total)  by mouth every 8 (eight) hours.   ISOSORBIDE MONONITRATE (IMDUR) 30 MG 24 HR TABLET    Take 1 tablet (30 mg total) by mouth daily.   NITROGLYCERIN (NITROSTAT) 0.4 MG SL TABLET    Place 1 tablet (0.4 mg total) under the tongue every 5 (five) minutes x 3 doses as needed for chest pain.   PROPANTHELINE (PROBANTHINE) 15 MG TABLET    Take 15 mg by mouth 3 (three) times daily with meals.   SERTRALINE (ZOLOFT) 100 MG TABLET    Take 100 mg by mouth daily.  Modified Medications   No medications on file  Discontinued Medications   No medications on file     Review of Systems  Constitutional: Negative for fever, chills, activity change and fatigue.  HENT: Negative for sore throat and trouble swallowing.   Eyes: Negative for visual disturbance.  Respiratory: Negative for cough, chest tightness and shortness of breath.   Cardiovascular: Negative for chest pain, palpitations and leg swelling.    Gastrointestinal: Negative for nausea, vomiting, abdominal pain and blood in stool.  Genitourinary: Negative for urgency, frequency and difficulty urinating.  Musculoskeletal: Negative for arthralgias and gait problem.  Skin: Positive for rash.  Neurological: Positive for numbness. Negative for weakness and headaches.  Psychiatric/Behavioral: Negative for confusion and sleep disturbance. The patient is not nervous/anxious.     Filed Vitals:   05/21/14 0018  BP: 128/88  Pulse: 89  Temp: 98 F (36.7 C)   There is no weight on file to calculate BMI.  Physical Exam  Constitutional: He appears well-developed and well-nourished. He appears ill. No distress.  Neurological: He is alert.  Skin: Skin is warm and dry. Rash (scaling, plaque-like  on left posterior arm and right palmar surface both dry; no blisters) noted. No abrasion and no lesion noted. Rash is vesicular (left medial leg but no secondary signs of infection; base is red).     Labs reviewed: Admission on 02/28/2014, Discharged on 03/05/2014  Component Date Value Ref Range Status  . WBC 02/28/2014 10.4  4.0 - 10.5 K/uL Final  . RBC 02/28/2014 4.66  4.22 - 5.81 MIL/uL Final  . Hemoglobin 02/28/2014 13.0  13.0 - 17.0 g/dL Final  . HCT 02/28/2014 39.5  39.0 - 52.0 % Final  . MCV 02/28/2014 84.8  78.0 - 100.0 fL Final  . MCH 02/28/2014 27.9  26.0 - 34.0 pg Final  . MCHC 02/28/2014 32.9  30.0 - 36.0 g/dL Final  . RDW 02/28/2014 15.7* 11.5 - 15.5 % Final  . Platelets 02/28/2014 223  150 - 400 K/uL Final  . Sodium 02/28/2014 139  135 - 145 mmol/L Final   Please note change in reference range.  . Potassium 02/28/2014 4.0  3.5 - 5.1 mmol/L Final   Please note change in reference range.  . Chloride 02/28/2014 102  96 - 112 mEq/L Final  . CO2 02/28/2014 31  19 - 32 mmol/L Final  . Glucose, Bld 02/28/2014 99  70 - 99 mg/dL Final  . BUN 02/28/2014 16  6 - 23 mg/dL Final  . Creatinine, Ser 02/28/2014 0.64  0.50 - 1.35 mg/dL Final   . Calcium 02/28/2014 9.7  8.4 - 10.5 mg/dL Final  . Total Protein 02/28/2014 9.1* 6.0 - 8.3 g/dL Final  . Albumin 02/28/2014 4.7  3.5 - 5.2 g/dL Final  . AST 02/28/2014 24  0 - 37 U/L Final  . ALT 02/28/2014 17  0 - 53 U/L Final  . Alkaline Phosphatase 02/28/2014  116  39 - 117 U/L Final  . Total Bilirubin 02/28/2014 0.8  0.3 - 1.2 mg/dL Final  . GFR calc non Af Amer 02/28/2014 >90  >90 mL/min Final  . GFR calc Af Amer 02/28/2014 >90  >90 mL/min Final   Comment: (NOTE) The eGFR has been calculated using the CKD EPI equation. This calculation has not been validated in all clinical situations. eGFR's persistently <90 mL/min signify possible Chronic Kidney Disease.   . Anion gap 02/28/2014 6  5 - 15 Final  . Troponin I 02/28/2014 0.08* <0.031 ng/mL Final   Comment:        PERSISTENTLY INCREASED TROPONIN VALUES IN THE RANGE OF 0.04-0.49 ng/mL CAN BE SEEN IN:       -UNSTABLE ANGINA       -CONGESTIVE HEART FAILURE       -MYOCARDITIS       -CHEST TRAUMA       -ARRYHTHMIAS       -LATE PRESENTING MYOCARDIAL INFARCTION       -COPD   CLINICAL FOLLOW-UP RECOMMENDED. Please note change in reference range.   . Heparin Unfractionated 03/01/2014 0.23* 0.30 - 0.70 IU/mL Final   Comment:        IF HEPARIN RESULTS ARE BELOW EXPECTED VALUES, AND PATIENT DOSAGE HAS BEEN CONFIRMED, SUGGEST FOLLOW UP TESTING OF ANTITHROMBIN III LEVELS.   . WBC 03/01/2014 7.8  4.0 - 10.5 K/uL Final  . RBC 03/01/2014 4.03* 4.22 - 5.81 MIL/uL Final  . Hemoglobin 03/01/2014 11.1* 13.0 - 17.0 g/dL Final  . HCT 03/01/2014 33.3* 39.0 - 52.0 % Final  . MCV 03/01/2014 82.6  78.0 - 100.0 fL Final  . MCH 03/01/2014 27.5  26.0 - 34.0 pg Final  . MCHC 03/01/2014 33.3  30.0 - 36.0 g/dL Final  . RDW 03/01/2014 15.8* 11.5 - 15.5 % Final  . Platelets 03/01/2014 201  150 - 400 K/uL Final  . Troponin I 02/28/2014 0.06* <0.031 ng/mL Final   Comment:        PERSISTENTLY INCREASED TROPONIN VALUES IN THE RANGE OF  0.04-0.49 ng/mL CAN BE SEEN IN:       -UNSTABLE ANGINA       -CONGESTIVE HEART FAILURE       -MYOCARDITIS       -CHEST TRAUMA       -ARRYHTHMIAS       -LATE PRESENTING MYOCARDIAL INFARCTION       -COPD   CLINICAL FOLLOW-UP RECOMMENDED. Please note change in reference range.   . Troponin I 03/01/2014 0.06* <0.031 ng/mL Final   Comment:        PERSISTENTLY INCREASED TROPONIN VALUES IN THE RANGE OF 0.04-0.49 ng/mL CAN BE SEEN IN:       -UNSTABLE ANGINA       -CONGESTIVE HEART FAILURE       -MYOCARDITIS       -CHEST TRAUMA       -ARRYHTHMIAS       -LATE PRESENTING MYOCARDIAL INFARCTION       -COPD   CLINICAL FOLLOW-UP RECOMMENDED. Please note change in reference range.   . Hgb A1c MFr Bld 02/28/2014 5.4  <5.7 % Final   Comment: (NOTE)  According to the ADA Clinical Practice Recommendations for 2011, when HbA1c is used as a screening test:  >=6.5%   Diagnostic of Diabetes Mellitus           (if abnormal result is confirmed) 5.7-6.4%   Increased risk of developing Diabetes Mellitus References:Diagnosis and Classification of Diabetes Mellitus,Diabetes XVQM,0867,61(PJKDT 1):S62-S69 and Standards of Medical Care in         Diabetes - 2011,Diabetes OIZT,2458,09 (Suppl 1):S11-S61.   . Mean Plasma Glucose 02/28/2014 108  <117 mg/dL Final   Performed at [facility]  . Sodium 03/01/2014 139  135 - 145 mmol/L Final   Please note change in reference range.  . Potassium 03/01/2014 3.5  3.5 - 5.1 mmol/L Final   Please note change in reference range.  . Chloride 03/01/2014 103  96 - 112 mEq/L Final  . CO2 03/01/2014 25  19 - 32 mmol/L Final  . Glucose, Bld 03/01/2014 110* 70 - 99 mg/dL Final  . BUN 03/01/2014 12  6 - 23 mg/dL Final  . Creatinine, Ser 03/01/2014 0.69  0.50 - 1.35 mg/dL Final  . Calcium 03/01/2014 9.3  8.4 - 10.5 mg/dL Final  . GFR calc non Af Amer 03/01/2014 >90  >90 mL/min Final  . GFR calc  Af Amer 03/01/2014 >90  >90 mL/min Final   Comment: (NOTE) The eGFR has been calculated using the CKD EPI equation. This calculation has not been validated in all clinical situations. eGFR's persistently <90 mL/min signify possible Chronic Kidney Disease.   . Anion gap 03/01/2014 11  5 - 15 Final  . Total CK 02/28/2014 46  7 - 232 U/L Final  . CK, MB 02/28/2014 1.7  0.3 - 4.0 ng/mL Final  . Relative Index 02/28/2014 RELATIVE INDEX IS INVALID  0.0 - 2.5 Final   Comment: WHEN CK < 100 U/L          . MRSA by PCR 02/28/2014 POSITIVE* NEGATIVE Final   Comment:        The GeneXpert MRSA Assay (FDA approved for NASAL specimens only), is one component of a comprehensive MRSA colonization surveillance program. It is not intended to diagnose MRSA infection nor to guide or monitor treatment for MRSA infections. RESULT CALLED TO, READ BACK BY AND VERIFIED WITH: HILDA MOOSE,RN 03/01/2014 [place]   . Cholesterol 03/01/2014 205* 0 - 200 mg/dL Final  . Triglycerides 03/01/2014 150* <150 mg/dL Final  . HDL 03/01/2014 25* >39 mg/dL Final  . Total CHOL/HDL Ratio 03/01/2014 8.2   Final  . VLDL 03/01/2014 30  0 - 40 mg/dL Final  . LDL Cholesterol 03/01/2014 150* 0 - 99 mg/dL Final   Comment:        Total Cholesterol/HDL:CHD Risk Coronary Heart Disease Risk Table                     Men   Women  1/2 Average Risk   3.4   3.3  Average Risk       5.0   4.4  2 X Average Risk   9.6   7.1  3 X Average Risk  23.4   11.0        Use the calculated Patient Ratio above and the CHD Risk Table to determine the patient's CHD Risk.        ATP III CLASSIFICATION (LDL):  <100     mg/dL   Optimal  100-129  mg/dL   Near or Above  Optimal  130-159  mg/dL   Borderline  160-189  mg/dL   High  >190     mg/dL   Very High   . Heparin Unfractionated 03/01/2014 0.50  0.30 - 0.70 IU/mL Final   Comment:        IF HEPARIN RESULTS ARE BELOW EXPECTED VALUES, AND PATIENT DOSAGE HAS BEEN  CONFIRMED, SUGGEST FOLLOW UP TESTING OF ANTITHROMBIN III LEVELS.   Marland Kitchen Heparin Unfractionated 03/01/2014 0.40  0.30 - 0.70 IU/mL Final   Comment:        IF HEPARIN RESULTS ARE BELOW EXPECTED VALUES, AND PATIENT DOSAGE HAS BEEN CONFIRMED, SUGGEST FOLLOW UP TESTING OF ANTITHROMBIN III LEVELS.   Marland Kitchen Heparin Unfractionated 03/02/2014 0.41  0.30 - 0.70 IU/mL Final   Comment:        IF HEPARIN RESULTS ARE BELOW EXPECTED VALUES, AND PATIENT DOSAGE HAS BEEN CONFIRMED, SUGGEST FOLLOW UP TESTING OF ANTITHROMBIN III LEVELS.   . WBC 03/02/2014 8.4  4.0 - 10.5 K/uL Final  . RBC 03/02/2014 4.13* 4.22 - 5.81 MIL/uL Final  . Hemoglobin 03/02/2014 11.2* 13.0 - 17.0 g/dL Final  . HCT 03/02/2014 34.2* 39.0 - 52.0 % Final  . MCV 03/02/2014 82.8  78.0 - 100.0 fL Final  . MCH 03/02/2014 27.1  26.0 - 34.0 pg Final  . MCHC 03/02/2014 32.7  30.0 - 36.0 g/dL Final  . RDW 03/02/2014 15.9* 11.5 - 15.5 % Final  . Platelets 03/02/2014 193  150 - 400 K/uL Final  . Heparin Unfractionated 03/03/2014 0.34  0.30 - 0.70 IU/mL Final   Comment:        IF HEPARIN RESULTS ARE BELOW EXPECTED VALUES, AND PATIENT DOSAGE HAS BEEN CONFIRMED, SUGGEST FOLLOW UP TESTING OF ANTITHROMBIN III LEVELS.   . WBC 03/03/2014 10.1  4.0 - 10.5 K/uL Final  . RBC 03/03/2014 4.42  4.22 - 5.81 MIL/uL Final  . Hemoglobin 03/03/2014 12.5* 13.0 - 17.0 g/dL Final  . HCT 03/03/2014 37.9* 39.0 - 52.0 % Final  . MCV 03/03/2014 85.7  78.0 - 100.0 fL Final  . MCH 03/03/2014 28.3  26.0 - 34.0 pg Final  . MCHC 03/03/2014 33.0  30.0 - 36.0 g/dL Final  . RDW 03/03/2014 15.9* 11.5 - 15.5 % Final  . Platelets 03/03/2014 245  150 - 400 K/uL Final  . Prothrombin Time 03/03/2014 13.4  11.6 - 15.2 seconds Final  . INR 03/03/2014 1.01  0.00 - 1.49 Final  . WBC 03/04/2014 9.8  4.0 - 10.5 K/uL Final  . RBC 03/04/2014 4.14* 4.22 - 5.81 MIL/uL Final  . Hemoglobin 03/04/2014 11.2* 13.0 - 17.0 g/dL Final  . HCT 03/04/2014 34.4* 39.0 - 52.0 % Final  . MCV  03/04/2014 83.1  78.0 - 100.0 fL Final  . MCH 03/04/2014 27.1  26.0 - 34.0 pg Final  . MCHC 03/04/2014 32.6  30.0 - 36.0 g/dL Final  . RDW 03/04/2014 16.0* 11.5 - 15.5 % Final  . Platelets 03/04/2014 240  150 - 400 K/uL Final  . Activated Clotting Time 03/03/2014 140   Final  . Activated Clotting Time 03/03/2014 190   Final  . Sodium 03/04/2014 138  135 - 145 mmol/L Final   Please note change in reference range.  . Potassium 03/04/2014 3.6  3.5 - 5.1 mmol/L Final   Please note change in reference range.  . Chloride 03/04/2014 107  96 - 112 mEq/L Final  . CO2 03/04/2014 22  19 - 32 mmol/L Final  . Glucose, Bld 03/04/2014 111* 70 -  99 mg/dL Final  . BUN 03/04/2014 12  6 - 23 mg/dL Final  . Creatinine, Ser 03/04/2014 0.60  0.50 - 1.35 mg/dL Final  . Calcium 03/04/2014 9.6  8.4 - 10.5 mg/dL Final  . GFR calc non Af Amer 03/04/2014 >90  >90 mL/min Final  . GFR calc Af Amer 03/04/2014 >90  >90 mL/min Final   Comment: (NOTE) The eGFR has been calculated using the CKD EPI equation. This calculation has not been validated in all clinical situations. eGFR's persistently <90 mL/min signify possible Chronic Kidney Disease.   . Anion gap 03/04/2014 9  5 - 15 Final  . WBC 03/04/2014 8.7  4.0 - 10.5 K/uL Final  . RBC 03/04/2014 3.89* 4.22 - 5.81 MIL/uL Final  . Hemoglobin 03/04/2014 11.2* 13.0 - 17.0 g/dL Final  . HCT 03/04/2014 33.2* 39.0 - 52.0 % Final  . MCV 03/04/2014 85.3  78.0 - 100.0 fL Final  . MCH 03/04/2014 28.8  26.0 - 34.0 pg Final  . MCHC 03/04/2014 33.7  30.0 - 36.0 g/dL Final  . RDW 03/04/2014 16.1* 11.5 - 15.5 % Final  . Platelets 03/04/2014 198  150 - 400 K/uL Final  . Creatinine, Ser 03/04/2014 0.67  0.50 - 1.35 mg/dL Final  . GFR calc non Af Amer 03/04/2014 >90  >90 mL/min Final  . GFR calc Af Amer 03/04/2014 >90  >90 mL/min Final   Comment: (NOTE) The eGFR has been calculated using the CKD EPI equation. This calculation has not been validated in all clinical  situations. eGFR's persistently <90 mL/min signify possible Chronic Kidney Disease.   . Glucose-Capillary 03/04/2014 104* 70 - 99 mg/dL Final  . WBC 03/05/2014 8.9  4.0 - 10.5 K/uL Final  . RBC 03/05/2014 3.63* 4.22 - 5.81 MIL/uL Final  . Hemoglobin 03/05/2014 10.1* 13.0 - 17.0 g/dL Final  . HCT 03/05/2014 30.4* 39.0 - 52.0 % Final  . MCV 03/05/2014 83.7  78.0 - 100.0 fL Final  . MCH 03/05/2014 27.8  26.0 - 34.0 pg Final  . MCHC 03/05/2014 33.2  30.0 - 36.0 g/dL Final  . RDW 03/05/2014 16.1* 11.5 - 15.5 % Final  . Platelets 03/05/2014 200  150 - 400 K/uL Final  . Sodium 03/05/2014 139  135 - 145 mmol/L Final   Please note change in reference range.  . Potassium 03/05/2014 3.6  3.5 - 5.1 mmol/L Final   Please note change in reference range.  . Chloride 03/05/2014 108  96 - 112 mEq/L Final  . CO2 03/05/2014 25  19 - 32 mmol/L Final  . Glucose, Bld 03/05/2014 98  70 - 99 mg/dL Final  . BUN 03/05/2014 14  6 - 23 mg/dL Final  . Creatinine, Ser 03/05/2014 0.75  0.50 - 1.35 mg/dL Final  . Calcium 03/05/2014 9.0  8.4 - 10.5 mg/dL Final  . GFR calc non Af Amer 03/05/2014 >90  >90 mL/min Final  . GFR calc Af Amer 03/05/2014 >90  >90 mL/min Final   Comment: (NOTE) The eGFR has been calculated using the CKD EPI equation. This calculation has not been validated in all clinical situations. eGFR's persistently <90 mL/min signify possible Chronic Kidney Disease.   . Anion gap 03/05/2014 6  5 - 15 Final  . Activated Clotting Time 03/04/2014 595   Final  . Glucose-Capillary 03/04/2014 112* 70 - 99 mg/dL Final  . Comment 1 03/04/2014 Notify RN   Final  . Glucose-Capillary 03/04/2014 79  70 - 99 mg/dL Final  . Comment 1  03/04/2014 Notify RN   Final  . Comment 2 03/04/2014 Documented in Chart   Final  . Glucose-Capillary 03/05/2014 103* 70 - 99 mg/dL Final  . Comment 1 03/05/2014 Notify RN   Final  . Comment 2 03/05/2014 Documented in Chart   Final     Assessment/Plan   ICD-9-CM ICD-10-CM    1. Shingles - left medial leg 053.9 B02.9   2. Contact dermatitis due to eczema 692.9 L25.9   3. Essential hypertension - stable on tx 401.9 I10   4. Diabetes mellitus without complication - controlled without meds 250.00 E11.9     --valtrex 1 gm po TID x 7 days  --contact isolation until rash crusts over to prevent spread of disease  --practice good hand hygiene  --cool compresses prn itching  --kenalog ointment to hand and arm rash daily until it resolves then may use prn  --will follow  Brazil Voytko S. Perlie Gold  [facility] [address] [place], [place] [address] [phone] Office (Wednesdays and Fridays 8 AM - 5 PM) [phone] Cell (Monday-Friday 8 AM - 5 PM)

## 2014-06-08 DIAGNOSIS — F32A Depression, unspecified: Secondary | ICD-10-CM | POA: Insufficient documentation

## 2014-06-08 DIAGNOSIS — M1A9XX0 Chronic gout, unspecified, without tophus (tophi): Secondary | ICD-10-CM | POA: Insufficient documentation

## 2014-06-08 DIAGNOSIS — F329 Major depressive disorder, single episode, unspecified: Secondary | ICD-10-CM | POA: Insufficient documentation

## 2014-06-08 DIAGNOSIS — E559 Vitamin D deficiency, unspecified: Secondary | ICD-10-CM | POA: Insufficient documentation

## 2014-06-08 NOTE — Progress Notes (Signed)
Patient ID: Dylan Malone, male   DOB: Nov 20, 1945, 69 y.o.   MRN: [id number]  [facility]     No Known Allergies     Chief Complaint  Patient presents with  . Medical Management of Chronic Issues    HPI:  He is a resident of this facility being seen for the management of his chronic illnesses. Overall his status is stable. The therapy staff is concerned that he has increased tone present in his left lower extremity. There are no nursing concerns at this time.    Past Medical History  Diagnosis Date  . Stroke     a. wheelchair bound and swallowing difficulties  . Hypertension   . Diabetes mellitus without complication   . Ischemic cardiomyopathy     a. 03/01/14: 2D ECHO w/ LVEF 35-40%, mod LVH, inferosept/inf hypokinesis,  . CAD (coronary artery disease)     a. 03/04/14 [place] s/p DES to mLAD and DES to [facility]  . Aortic stenosis     a. 03/01/14 ECHO moderate with AVA 1.1, Pk grad 32.  Marland Kitchen HLD (hyperlipidemia)     Past Surgical History  Procedure Laterality Date  . Left heart catheterization with coronary angiogram N/A 03/03/2014    Procedure: LEFT HEART CATHETERIZATION WITH CORONARY ANGIOGRAM;  Surgeon: Kathleene Hazel, MD;  Location: [facility] CATH LAB;  Service: Cardiovascular;  Laterality: N/A;  . Percutaneous coronary stent intervention (pci-s) N/A 03/04/2014    Procedure: PERCUTANEOUS CORONARY STENT INTERVENTION (PCI-S);  Surgeon: Marykay Lex, MD;  Location: [facility] CATH LAB;  Service: Cardiovascular;  Laterality: N/A;    VITAL SIGNS BP 138/68 mmHg  Pulse 90  Ht  (1.727 m)  Wt 157 lb (71.215 kg)  BMI 23.88 kg/m2   Outpatient Encounter Prescriptions as of 05/08/2014  Medication Sig  . allopurinol (ZYLOPRIM) 300 MG tablet Take 300 mg by mouth daily.  Marland Kitchen aspirin EC 81 MG tablet Take 81 mg by mouth daily.  Marland Kitchen atorvastatin (LIPITOR) 80 MG tablet Take 1 tablet (80 mg total) by mouth daily at 6 PM.   Baclofen 5 mg  Three times daily   . benzonatate (TESSALON) 100 MG capsule Take  by mouth 2 (two) times daily as needed for cough.  . carvedilol (COREG) 6.25 MG tablet Take 1 tablet (6.25 mg total) by mouth 2 (two) times daily with a meal.  . clopidogrel (PLAVIX) 75 MG tablet Take 1 tablet (75 mg total) by mouth daily.  . enalapril (VASOTEC) 20 MG tablet Take 1 tablet (20 mg total) by mouth 2 (two) times daily.  . hydrALAZINE (APRESOLINE) 25 MG tablet Take 0.5 tablets (12.5 mg total) by mouth every 8 (eight) hours.  . isosorbide mononitrate (IMDUR) 30 MG 24 hr tablet Take 1 tablet (30 mg total) by mouth daily.  . nitroGLYCERIN (NITROSTAT) 0.4 MG SL tablet Place 1 tablet (0.4 mg total) under the tongue every 5 (five) minutes x 3 doses as needed for chest pain.  Marland Kitchen propantheline (PROBANTHINE) 15 MG tablet Take 15 mg by mouth 3 (three) times daily with meals.  . sertraline (ZOLOFT) 100 MG tablet Take 150 mg by mouth daily.     SIGNIFICANT DIAGNOSTIC EXAMS  LABS REVIEWED:   03-22-14: tsh 4.7; vit d 12.31 03-25-14: urine culture: no growth     Review of Systems  Constitutional: Negative for malaise/fatigue.  Respiratory: Negative for cough and shortness of breath.   Cardiovascular: Negative for chest pain, palpitations and leg swelling.  Gastrointestinal: Negative for abdominal pain and constipation.  Musculoskeletal: Positive for myalgias. Negative for joint pain.  Skin: Negative.   Neurological: Negative for headaches.  Psychiatric/Behavioral: The patient is not nervous/anxious.      Physical Exam  Constitutional: He appears well-developed and well-nourished. No distress.  Neck: Neck supple. No JVD present. No thyromegaly present.  Bilateral carotid bruits   Cardiovascular: Normal rate, regular rhythm and intact distal pulses.   Murmur heard. Respiratory: Effort normal and breath sounds normal. No respiratory distress.  GI: Soft. Bowel sounds are normal. He exhibits no distension.  Musculoskeletal: He exhibits no edema.  Has no movement to left upper  extremity Has some movement to left lower extremity Has increased tone to left lower extremity Able to move right extremities   Neurological: He is alert.  Skin: Skin is warm and dry. He is not diaphoretic.       ASSESSMENT/ PLAN:  1. Gout: no recent flares present will continue allopurinol 300 mg daily   2. Dyslipidemia: will continue lipitor 80 mg daily  3. Hypertension: will continue vasotec 20 mg twice daily and coreg 6.25 mg twice daily and asa 81 mg daily and hydralazine 12.5 mg three times daily  4. CAD: no complaints of chest pain present; will continue asa 81 mg dialy ; imdur 30 mg daily; plavix 75 mg daily and ntg prn  5. CVA: neurologically he remains stable; he continues with therapy as directed; will continue asa 81 mg daily; plavix 75 mg daily will increase his baclofen to 10 mg three times daily will monitor  6. Depression: will continue zoloft 150 mg daily   7. Vit d deficiency: will begin vit d 50,000 units weekly   Synthia Innocenteborah Coron Rossano NP [facility]  Contact [phone] Monday through Friday 8am- 5pm  After hours call [phone]

## 2014-06-12 ENCOUNTER — Encounter: Payer: Self-pay | Admitting: Adult Health

## 2014-06-12 ENCOUNTER — Non-Acute Institutional Stay (SKILLED_NURSING_FACILITY): Payer: Medicare Other | Admitting: Adult Health

## 2014-06-12 DIAGNOSIS — I25110 Atherosclerotic heart disease of native coronary artery with unstable angina pectoris: Secondary | ICD-10-CM | POA: Diagnosis not present

## 2014-06-12 DIAGNOSIS — I69854 Hemiplegia and hemiparesis following other cerebrovascular disease affecting left non-dominant side: Secondary | ICD-10-CM | POA: Diagnosis not present

## 2014-06-12 DIAGNOSIS — I10 Essential (primary) hypertension: Secondary | ICD-10-CM | POA: Diagnosis not present

## 2014-06-12 DIAGNOSIS — E785 Hyperlipidemia, unspecified: Secondary | ICD-10-CM

## 2014-06-12 DIAGNOSIS — E559 Vitamin D deficiency, unspecified: Secondary | ICD-10-CM

## 2014-06-12 DIAGNOSIS — I69354 Hemiplegia and hemiparesis following cerebral infarction affecting left non-dominant side: Secondary | ICD-10-CM

## 2014-06-12 DIAGNOSIS — M1A9XX0 Chronic gout, unspecified, without tophus (tophi): Secondary | ICD-10-CM | POA: Diagnosis not present

## 2014-06-12 DIAGNOSIS — I639 Cerebral infarction, unspecified: Secondary | ICD-10-CM | POA: Diagnosis not present

## 2014-06-12 MED ORDER — HYDRALAZINE HCL 25 MG PO TABS: 25.0000 mg | ORAL_TABLET | Freq: Three times a day (TID) | ORAL | Status: AC

## 2014-06-12 NOTE — Progress Notes (Signed)
Patient ID: Dylan Malone, male   DOB: [birth year], 69 y.o.   MRN: [id number]  [facility]     No Known Allergies     Chief Complaint  Patient presents with  . Medical Management of Chronic Issues    HPI:  He is a resident of thi facility being seen for the management of his chronic illnesses. Overall his status remains without change. He is not voicing any concerns or nursing complaints. There are no nursing concerns at this time.    Past Medical History  Diagnosis Date  . Stroke     a. wheelchair bound and swallowing difficulties  . Hypertension   . Diabetes mellitus without complication   . Ischemic cardiomyopathy     a. 03/01/14: 2D ECHO w/ LVEF 35-40%, mod LVH, inferosept/inf hypokinesis,  . CAD (coronary artery disease)     a. 03/04/14 [place] s/p DES to mLAD and DES to [facility]  . Aortic stenosis     a. 03/01/14 ECHO moderate with AVA 1.1, Pk grad 32.  Marland Kitchen HLD (hyperlipidemia)     Past Surgical History  Procedure Laterality Date  . Left heart catheterization with coronary angiogram N/A 03/03/2014    Procedure: LEFT HEART CATHETERIZATION WITH CORONARY ANGIOGRAM;  Surgeon: Kathleene Hazel, MD;  Location: [facility] CATH LAB;  Service: Cardiovascular;  Laterality: N/A;  . Percutaneous coronary stent intervention (pci-s) N/A 03/04/2014    Procedure: PERCUTANEOUS CORONARY STENT INTERVENTION (PCI-S);  Surgeon: Marykay Lex, MD;  Location: [facility] CATH LAB;  Service: Cardiovascular;  Laterality: N/A;    VITAL SIGNS BP 161/80 mmHg  Pulse 62  Ht  (1.727 m)  Wt 158 lb (71.668 kg)  BMI 24.03 kg/m2  SpO2 96%   Outpatient Encounter Prescriptions as of 06/12/2014  Medication Sig  . allopurinol (ZYLOPRIM) 300 MG tablet Take 300 mg by mouth daily.  Marland Kitchen aspirin EC 81 MG tablet Take 81 mg by mouth daily.  Marland Kitchen atorvastatin (LIPITOR) 80 MG tablet Take 1 tablet (80 mg total) by mouth daily at 6 PM.  . baclofen (LIORESAL) 10 MG tablet Take 10 mg by mouth 3 (three) times daily.   . benzonatate  (TESSALON) 100 MG capsule Take by mouth 2 (two) times daily as needed for cough.  . carvedilol (COREG) 6.25 MG tablet Take 1 tablet (6.25 mg total) by mouth 2 (two) times daily with a meal.  . clopidogrel (PLAVIX) 75 MG tablet Take 1 tablet (75 mg total) by mouth daily.  . enalapril (VASOTEC) 20 MG tablet Take 1 tablet (20 mg total) by mouth 2 (two) times daily.  . hydrALAZINE (APRESOLINE) 25 MG tablet Take 0.5 tablets (12.5 mg total) by mouth every 8 (eight) hours.  . isosorbide mononitrate (IMDUR) 30 MG 24 hr tablet Take 1 tablet (30 mg total) by mouth daily.  . nitroGLYCERIN (NITROSTAT) 0.4 MG SL tablet Place 1 tablet (0.4 mg total) under the tongue every 5 (five) minutes x 3 doses as needed for chest pain.  Marland Kitchen propantheline (PROBANTHINE) 15 MG tablet Take 15 mg by mouth 3 (three) times daily with meals.  . sertraline (ZOLOFT) 100 MG tablet Take 150 mg by mouth at bedtime.   . Vitamin D, Ergocalciferol, (DRISDOL) 50000 UNITS CAPS capsule Take 50,000 Units by mouth every 7 (seven) days.     SIGNIFICANT DIAGNOSTIC EXAMS   LABS REVIEWED:   02-28-14: hgb a1c 5.4 03-01-14: chol 205; ldl 150; trig 150; hdl 25 05-31-79: wbc 8.9; hgb 10.1; hct 30.4; mcv 83.7; plt 200 glucose 98;  bun 14; creat 0.75; k+3.6; na++139  03-22-14: tsh 4.7; vit d 12.31 03-25-14: urine culture: no growth      ROS Constitutional: Negative for malaise/fatigue.  Respiratory: Negative for cough and shortness of breath.   Cardiovascular: Negative for chest pain, palpitations and leg swelling.  Gastrointestinal: Negative for abdominal pain and constipation.  Musculoskeletal: Positive for myalgias. Negative for joint pain.  Skin: Negative.   Neurological: Negative for headaches.  Psychiatric/Behavioral: The patient is not nervous/anxious.      Physical Exam Constitutional: He appears well-developed and well-nourished. No distress.  Neck: Neck supple. No JVD present. No thyromegaly present.  Bilateral carotid bruits     Cardiovascular: Normal rate, regular rhythm and intact distal pulses.   Murmur heard. Respiratory: Effort normal and breath sounds normal. No respiratory distress.  GI: Soft. Bowel sounds are normal. He exhibits no distension.  Musculoskeletal: He exhibits no edema.  Has no movement to left upper extremity Has some movement to left lower extremity Able to move right extremities   Neurological: He is alert.  Skin: Skin is warm and dry. He is not diaphoretic.     ASSESSMENT/ PLAN:   1. Gout: no recent flares present will continue allopurinol 300 mg daily   2. Dyslipidemia: will continue lipitor 80 mg daily; ldl is 150   3. Hypertension: will continue vasotec 20 mg twice daily and coreg 6.25 mg twice daily and asa 81 mg daily and will increase  hydralazine to 25 mg three times daily will have   4. CAD: no complaints of chest pain present; will continue asa 81 mg dialy ; imdur 30 mg daily; plavix 75 mg daily and ntg prn  5. CVA with left hemiparesis: neurologically he remains stable;  will continue asa 81 mg daily; plavix 75 mg daily will continue baclofen  10 mg three times daily will monitor  6. Depression: will continue zoloft 150 mg nightly   7. Vit d deficiency: will continue  vit d 50,000 units weekly  Last vit d level in jan 2016: 12.31    Synthia Innocenteborah Helmi Hechavarria NP [facility]  Contact [phone] Monday through Friday 8am- 5pm  After hours call [phone]

## 2014-06-27 ENCOUNTER — Encounter: Payer: Self-pay | Admitting: *Deleted

## 2014-07-23 ENCOUNTER — Non-Acute Institutional Stay (SKILLED_NURSING_FACILITY): Payer: Medicare Other | Admitting: Adult Health

## 2014-07-23 ENCOUNTER — Encounter: Payer: Self-pay | Admitting: Adult Health

## 2014-07-23 DIAGNOSIS — I10 Essential (primary) hypertension: Secondary | ICD-10-CM

## 2014-07-23 DIAGNOSIS — R05 Cough: Secondary | ICD-10-CM | POA: Diagnosis not present

## 2014-07-23 DIAGNOSIS — E559 Vitamin D deficiency, unspecified: Secondary | ICD-10-CM

## 2014-07-23 DIAGNOSIS — M1A9XX0 Chronic gout, unspecified, without tophus (tophi): Secondary | ICD-10-CM

## 2014-07-23 DIAGNOSIS — E119 Type 2 diabetes mellitus without complications: Secondary | ICD-10-CM

## 2014-07-23 DIAGNOSIS — E785 Hyperlipidemia, unspecified: Secondary | ICD-10-CM | POA: Diagnosis not present

## 2014-07-23 DIAGNOSIS — R059 Cough, unspecified: Secondary | ICD-10-CM

## 2014-07-23 DIAGNOSIS — F329 Major depressive disorder, single episode, unspecified: Secondary | ICD-10-CM

## 2014-07-23 DIAGNOSIS — I69854 Hemiplegia and hemiparesis following other cerebrovascular disease affecting left non-dominant side: Secondary | ICD-10-CM

## 2014-07-23 DIAGNOSIS — I69354 Hemiplegia and hemiparesis following cerebral infarction affecting left non-dominant side: Secondary | ICD-10-CM

## 2014-07-23 DIAGNOSIS — F32A Depression, unspecified: Secondary | ICD-10-CM

## 2014-07-23 MED ORDER — LOSARTAN POTASSIUM 25 MG PO TABS: 25.0000 mg | ORAL_TABLET | Freq: Every day | ORAL | Status: AC

## 2014-07-23 NOTE — Progress Notes (Signed)
Patient ID: Dylan SpanielJose Bielefeld, male   DOB: Sep 10, 1945, 69 y.o.   MRN: [id number]  [facility]     No Known Allergies     Chief Complaint  Patient presents with  . Medical Management of Chronic Issues    HPI:    Past Medical History  Diagnosis Date  . Stroke     a. wheelchair bound and swallowing difficulties  . Hypertension   . Diabetes mellitus without complication   . Ischemic cardiomyopathy     a. 03/01/14: 2D ECHO w/ LVEF 35-40%, mod LVH, inferosept/inf hypokinesis,  . CAD (coronary artery disease)     a. 03/04/14 [place] s/[place] DES [facility]AD [facility]  . Aortic stenosis     a. 03/01/14 ECHO moderate with AVA 1.1, Pk grad 32.  Marland Kitchen. HLD (hyperlipidemia)     Past Surgical History  Procedure Laterality Date  . Left heart catheterization with coronary angiogram N/A 03/03/2014    Procedure: LEFT HEART CATHETERIZATION WITH CORONARY ANGIOGRAM;  Surgeon: Kathleene Hazelhristopher D McAlhany, MD;  [facility];  Service: Cardiovascular;  Laterality: N/A;  . Percutaneous coronary stent intervention (pci-s) N/A 03/04/2014    Procedure: PERCUTANEOUS CORONARY STENT INTERVENTION (PCI-S);  Surgeon: Marykay Lexavid W Harding, MD;  [facility];  Service: Cardiovascular;  Laterality: N/A;    VITAL SIGNS BP 136/60 mmHg  Pulse 60  Ht 5\' 8"  (1.727 m)  Wt 156 lb (70.761 kg)  BMI 23.73 kg/m2  SpO2 98%   Outpatient Encounter Prescriptions as of 07/23/2014  Medication Sig  . allopurinol (ZYLOPRIM) 300 MG tablet Take 300 mg by mouth daily.  Marland Kitchen. aspirin EC 81 MG tablet Take 81 mg by mouth daily.  Marland Kitchen. atorvastatin (LIPITOR) 80 MG tablet Take 1 tablet (80 mg total) by mouth daily at 6 PM.  . baclofen (LIORESAL) 10 MG tablet Take 10 mg by mouth 3 (three) times daily.   . benzonatate (TESSALON) 100 MG capsule Take by mouth 2 (two) times daily as needed for cough.  . carvedilol (COREG) 6.25 MG tablet Take 1 tablet (6.25 mg total) by mouth 2 (two) times daily with a meal.  . clopidogrel (PLAVIX) 75 MG tablet Take 1  tablet (75 mg total) by mouth daily.  . enalapril (VASOTEC) 20 MG tablet Take 1 tablet (20 mg total) by mouth 2 (two) times daily.  . hydrALAZINE (APRESOLINE) 25 MG tablet Take 1 tablet (25 mg total) by mouth every 8 (eight) hours.  . isosorbide mononitrate (IMDUR) 30 MG 24 hr tablet Take 1 tablet (30 mg total) by mouth daily.  . nitroGLYCERIN (NITROSTAT) 0.4 MG SL tablet Place 1 tablet (0.4 mg total) under the tongue every 5 (five) minutes x 3 doses as needed for chest pain.  Marland Kitchen. propantheline (PROBANTHINE) 15 MG tablet Take 15 mg by mouth 3 (three) times daily with meals.  . sertraline (ZOLOFT) 100 MG tablet Take 150 mg by mouth at bedtime.   . Vitamin D, Ergocalciferol, (DRISDOL) 50000 UNITS CAPS capsule Take 50,000 Units by mouth every 7 (seven) days.      SIGNIFICANT DIAGNOSTIC EXAMS   LABS REVIEWED:   02-28-14: hgb a1c 5.4 03-01-14: chol 205; ldl 150; trig 150; hdl 25 1-32-441-13-16: wbc 8.9; hgb 10.1; hct 30.4; mcv 83.7; plt 200 glucose 98; bun 14; creat 0.75; k+3.6; na++139  03-22-14: tsh 4.7; vit d 12.31 03-25-14: urine culture: no growth      ROS Constitutional: Negative for malaise/fatigue.  Respiratory: Negative for  shortness of breath. Has cough with  congestion present   Cardiovascular: Negative for chest pain, palpitations and leg swelling.  Gastrointestinal: Negative for abdominal pain and constipation.  Musculoskeletal: negative for myalgias . Negative for joint pain.  Skin: Negative.   Neurological: Negative for headaches.  Psychiatric/Behavioral: The patient is not nervous/anxious.      Physical Exam Constitutional: He appears well-developed and well-nourished. No distress.  Neck: Neck supple. No JVD present. No thyromegaly present.  Bilateral carotid bruits   Cardiovascular: Normal rate, regular rhythm and intact distal pulses.   Murmur heard. Respiratory: Effort normal and breath sounds normal. No respiratory distress.  GI: Soft. Bowel sounds are normal. He exhibits  no distension.  Musculoskeletal: He exhibits no edema.  Has no movement to left upper extremity Has some movement to left lower extremity Able to move right extremities   Neurological: He is alert.  Skin: Skin is warm and dry. He is not diaphoretic.        ASSESSMENT/ PLAN:  1. Gout: no recent flares present will continue allopurinol 300 mg daily   2. Dyslipidemia: will continue lipitor 80 mg daily; ldl is 150   3. Hypertension: will  coreg 6.25 mg twice daily and asa 81 mg daily and will continue  hydralazine  25 mg three times daily     Will stop the vasotec due to cough and will start cozaar 25 mg daily and will monitor   4. CAD: no complaints of chest pain present; will continue asa 81 mg dialy ; imdur 30 mg daily; plavix 75 mg daily and ntg prn  5. CVA with left hemiparesis: neurologically he remains stable;  will continue asa 81 mg daily; plavix 75 mg daily will continue baclofen  10 mg three times daily will monitor  6. Depression: will continue zoloft 150 mg nightly   7. Vit d deficiency: will continue  vit d 50,000 units weekly  Last vit d level in jan 2016: 12.31   8. Cough: will get a chest x-ray to look at infections; I am concerned about an ace cough; however; at this time.   9. Diabetes: he is presently not on medications; does take ace/arb; asa and statin.   Will check cbc; cmp; lipids; hgb a1c vit d  Urine for micro-albumin   Synthia Innocent NP [facility]  Contact [phone] Monday through Friday 8am- 5pm  After hours call [phone]

## 2014-07-24 LAB — LIPID PANEL
Cholesterol: 90 mg/dL (ref 0–200)
HDL: 21 mg/dL — AB (ref 35–70)
LDL Cholesterol: 55 mg/dL
Triglycerides: 69 mg/dL (ref 40–160)

## 2014-07-24 LAB — CBC AND DIFFERENTIAL
HCT: 34 % — AB (ref 41–53)
Hemoglobin: 10.6 g/dL — AB (ref 13.5–17.5)
Platelets: 218 10*3/uL (ref 150–399)
WBC: 10.7 10^3/mL

## 2014-07-24 LAB — HEMOGLOBIN A1C: HEMOGLOBIN A1C: 5.6 % (ref 4.0–6.0)

## 2014-07-24 LAB — BASIC METABOLIC PANEL
BUN: 16 mg/dL (ref 4–21)
Creatinine: 0.5 mg/dL — AB (ref 0.6–1.3)
GLUCOSE: 94 mg/dL
Potassium: 4.2 mmol/L (ref 3.4–5.3)
SODIUM: 143 mmol/L (ref 137–147)

## 2014-07-24 LAB — HEPATIC FUNCTION PANEL
ALT: 21 U/L (ref 10–40)
AST: 18 U/L (ref 14–40)
Alkaline Phosphatase: 113 U/L (ref 25–125)

## 2014-08-27 ENCOUNTER — Non-Acute Institutional Stay (SKILLED_NURSING_FACILITY): Payer: Medicare Other | Admitting: Adult Health

## 2014-08-27 DIAGNOSIS — M1A9XX0 Chronic gout, unspecified, without tophus (tophi): Secondary | ICD-10-CM | POA: Diagnosis not present

## 2014-08-27 DIAGNOSIS — I10 Essential (primary) hypertension: Secondary | ICD-10-CM | POA: Diagnosis not present

## 2014-08-27 DIAGNOSIS — E785 Hyperlipidemia, unspecified: Secondary | ICD-10-CM

## 2014-08-27 DIAGNOSIS — F32A Depression, unspecified: Secondary | ICD-10-CM

## 2014-08-27 DIAGNOSIS — I69854 Hemiplegia and hemiparesis following other cerebrovascular disease affecting left non-dominant side: Secondary | ICD-10-CM | POA: Diagnosis not present

## 2014-08-27 DIAGNOSIS — F329 Major depressive disorder, single episode, unspecified: Secondary | ICD-10-CM | POA: Diagnosis not present

## 2014-08-27 DIAGNOSIS — I639 Cerebral infarction, unspecified: Secondary | ICD-10-CM | POA: Diagnosis not present

## 2014-08-27 DIAGNOSIS — E119 Type 2 diabetes mellitus without complications: Secondary | ICD-10-CM

## 2014-08-27 DIAGNOSIS — I69354 Hemiplegia and hemiparesis following cerebral infarction affecting left non-dominant side: Secondary | ICD-10-CM

## 2014-08-28 LAB — LIPID PANEL
Cholesterol: 90 mg/dL (ref 0–200)
HDL: 21 mg/dL — AB (ref 35–70)
LDL CALC: 55 mg/dL
TRIGLYCERIDES: 69 mg/dL (ref 40–160)

## 2014-08-29 LAB — MICROALBUMIN, URINE: Microalb, Ur: 1.2

## 2014-09-18 ENCOUNTER — Other Ambulatory Visit (HOSPITAL_COMMUNITY): Payer: Self-pay | Admitting: Internal Medicine

## 2014-09-18 DIAGNOSIS — R1314 Dysphagia, pharyngoesophageal phase: Secondary | ICD-10-CM

## 2014-09-25 ENCOUNTER — Non-Acute Institutional Stay (SKILLED_NURSING_FACILITY): Payer: Medicare Other | Admitting: Internal Medicine

## 2014-09-25 ENCOUNTER — Encounter: Payer: Self-pay | Admitting: Internal Medicine

## 2014-09-25 DIAGNOSIS — I255 Ischemic cardiomyopathy: Secondary | ICD-10-CM | POA: Diagnosis not present

## 2014-09-25 DIAGNOSIS — M79609 Pain in unspecified limb: Secondary | ICD-10-CM | POA: Diagnosis not present

## 2014-09-25 DIAGNOSIS — I69854 Hemiplegia and hemiparesis following other cerebrovascular disease affecting left non-dominant side: Secondary | ICD-10-CM

## 2014-09-25 DIAGNOSIS — I69354 Hemiplegia and hemiparesis following cerebral infarction affecting left non-dominant side: Secondary | ICD-10-CM

## 2014-09-25 DIAGNOSIS — E785 Hyperlipidemia, unspecified: Secondary | ICD-10-CM | POA: Diagnosis not present

## 2014-09-25 DIAGNOSIS — I350 Nonrheumatic aortic (valve) stenosis: Secondary | ICD-10-CM

## 2014-09-25 DIAGNOSIS — E119 Type 2 diabetes mellitus without complications: Secondary | ICD-10-CM | POA: Diagnosis not present

## 2014-09-25 DIAGNOSIS — M62838 Other muscle spasm: Secondary | ICD-10-CM | POA: Insufficient documentation

## 2014-09-25 DIAGNOSIS — M6249 Contracture of muscle, multiple sites: Secondary | ICD-10-CM | POA: Diagnosis not present

## 2014-09-25 DIAGNOSIS — IMO0001 Reserved for inherently not codable concepts without codable children: Secondary | ICD-10-CM

## 2014-09-25 DIAGNOSIS — I10 Essential (primary) hypertension: Secondary | ICD-10-CM | POA: Diagnosis not present

## 2014-09-25 NOTE — Progress Notes (Signed)
Patient ID: Dylan Malone, male   DOB: 1945/07/14, 69 y.o.   MRN: [id number]    DATE:09/25/14  [facility]  [facility]    Place of Service: SNF [phone])   Extended Emergency Contact Information Primary Emergency Contact: Bernard,Jomayra Address: [address] [place], [place] [address] [place] Home Phone: [phone] Relation: Daughter  Advanced Directive information  FULL CODE; MOST FORM ON CHART  Chief Complaint  Patient presents with  . Medical Management of Chronic Issues    HPI:  69 yo male long term resident seen today for f/u. He c/o left upper and lower extremity pain. He has not asked for Tylenol as it is prn according nursing. No new numbness. No rash. No recent falls. He is a poor historian due to mental status/psych issues. Hx obtained from chart  He takes baclofen for muscle spasms in extremities. It helps hm to perform PT.  Cholesterol controlled on lipitor. No myalgias  No gout attacks on allopurinol.   BP/ICM (EF 35-40%) controlled on coreg, cozaar, hydralazine, imdur. He takes plavix and ASA due to hx CVA and MI. He has not req'd SLNTG recently  Mood stable on sertraline  DM controlled by diet. CBGs 80-110s.  Past Medical History  Diagnosis Date  . Stroke     a. wheelchair bound and swallowing difficulties  . Hypertension   . Diabetes mellitus without complication   . Ischemic cardiomyopathy     a. 03/01/14: 2D ECHO w/ LVEF 35-40%, mod LVH, inferosept/inf hypokinesis,  . CAD (coronary artery disease)     a. 03/04/14 [place] s/p DES to mLAD and DES to [facility]  . Aortic stenosis     a. 03/01/14 ECHO moderate with AVA 1.1, Pk grad 32.  Marland Kitchen HLD (hyperlipidemia)     Past Surgical History  Procedure Laterality Date  . Left heart catheterization with coronary angiogram N/A 03/03/2014    Procedure: LEFT HEART CATHETERIZATION WITH CORONARY ANGIOGRAM;  Surgeon: Kathleene Hazel, MD;  Location: [facility] CATH LAB;  Service:  Cardiovascular;  Laterality: N/A;  . Percutaneous coronary stent intervention (pci-s) N/A 03/04/2014    Procedure: PERCUTANEOUS CORONARY STENT INTERVENTION (PCI-S);  Surgeon: Marykay Lex, MD;  Location: [facility] CATH LAB;  Service: Cardiovascular;  Laterality: N/A;    Patient Care Team: Macy Mis, MD as PCP - General (Family Medicine)  History   Social History  . Marital Status: Divorced    Spouse Name: N/A  . Number of Children: N/A  . Years of Education: N/A   Occupational History  . Not on file.   Social History Main Topics  . Smoking status: Never Smoker   . Smokeless tobacco: Not on file  . Alcohol Use: No  . Drug Use: Not on file  . Sexual Activity: Not on file   Other Topics Concern  . Not on file   Social History Narrative     reports that he has never smoked. He does not have any smokeless tobacco history on file. He reports that he does not drink alcohol. His drug history is not on file.  Immunization History  Administered Date(s) Administered  . PPD Test 03/05/2014    Allergies  Allergen Reactions  . Ace Inhibitors     Medications: Patient's Medications  New Prescriptions   No medications on file  Previous Medications   ALLOPURINOL (ZYLOPRIM) 300 MG TABLET    Take 300 mg by mouth daily.  ASPIRIN EC 81 MG TABLET    Take 81 mg by mouth daily.   ATORVASTATIN (LIPITOR) 80 MG TABLET    Take 1 tablet (80 mg total) by mouth daily at 6 PM.   BACLOFEN (LIORESAL) 10 MG TABLET    Take 10 mg by mouth 3 (three) times daily.    BENZONATATE (TESSALON) 100 MG CAPSULE    Take by mouth 2 (two) times daily as needed for cough.   CARVEDILOL (COREG) 6.25 MG TABLET    Take 1 tablet (6.25 mg total) by mouth 2 (two) times daily with a meal.   CLOPIDOGREL (PLAVIX) 75 MG TABLET    Take 1 tablet (75 mg total) by mouth daily.   HYDRALAZINE (APRESOLINE) 25 MG TABLET    Take 1 tablet (25 mg total) by mouth every 8 (eight) hours.   ISOSORBIDE MONONITRATE (IMDUR) 30 MG 24 HR  TABLET    Take 1 tablet (30 mg total) by mouth daily.   LOSARTAN (COZAAR) 25 MG TABLET    Take 1 tablet (25 mg total) by mouth daily.   NITROGLYCERIN (NITROSTAT) 0.4 MG SL TABLET    Place 1 tablet (0.4 mg total) under the tongue every 5 (five) minutes x 3 doses as needed for chest pain.   PROPANTHELINE (PROBANTHINE) 15 MG TABLET    Take 15 mg by mouth 3 (three) times daily with meals.   SERTRALINE (ZOLOFT) 100 MG TABLET    Take 150 mg by mouth at bedtime.    VITAMIN D, ERGOCALCIFEROL, (DRISDOL) 50000 UNITS CAPS CAPSULE    Take 50,000 Units by mouth every 7 (seven) days.  Modified Medications   No medications on file  Discontinued Medications   No medications on file    Review of Systems  Unable to perform ROS: Psychiatric disorder    Filed Vitals:   09/25/14 1651  BP: 134/71  Pulse: 64  Temp: 96.7 F (35.9 C)  SpO2: 97%   There is no weight on file to calculate BMI.  Physical Exam  Constitutional: He appears well-developed and well-nourished.  Sitting in w/c in NAD  HENT:  Mouth/Throat: Oropharynx is clear and moist.  Eyes: Pupils are equal, round, and reactive to light. No scleral icterus.  Neck: Neck supple. Carotid bruit is present (b/l). No thyromegaly present.  Cardiovascular: Normal rate, regular rhythm and intact distal pulses.  Exam reveals no gallop and no friction rub.   Murmur (2/6 SEM radiating to carotid b/l) heard. no distal LE swelling. No calf TTP  Pulmonary/Chest: Effort normal and breath sounds normal. He has no wheezes. He has no rales. He exhibits no tenderness.  Abdominal: Soft. Bowel sounds are normal. He exhibits no distension, no abdominal bruit, no pulsatile midline mass and no mass. There is no tenderness. There is no rebound and no guarding.  Musculoskeletal: He exhibits edema and tenderness.  Contracted LUE. Reduced ROM left knee with popliteal TTP. No swelling. Calf atrophy noted on left  Lymphadenopathy:    He has no cervical adenopathy.    Neurological: He is alert. He displays abnormal reflex.  Left sided hemiparesis with 2/5 grip strength  Skin: Skin is warm and dry. No rash noted.  Psychiatric: He has a normal mood and affect. His behavior is normal. Thought content normal.     Labs reviewed: Abstract on 06/27/2014  Component Date Value Ref Range Status  . TSH 03/22/2014 4.70  .41 - 5.90 uIU/mL Final   Recent Results (from the past 2160 hour(s))  CBC and  differential     Status: Abnormal   Collection Time: 07/24/14 12:00 AM  Result Value Ref Range   Hemoglobin 10.6 (A) 13.5 - 17.5 g/dL   HCT 34 (A) 41 - 53 %   Platelets 218 150 - 399 K/L   WBC 10.7 10^3/mL  Basic metabolic panel     Status: Abnormal   Collection Time: 07/24/14 12:00 AM  Result Value Ref Range   Glucose 94 mg/dL   BUN 16 4 - 21 mg/dL   Creatinine 0.5 (A) 0.6 - 1.3 mg/dL   Potassium 4.2 3.4 - 5.3 mmol/L   Sodium 143 137 - 147 mmol/L  Lipid panel     Status: Abnormal   Collection Time: 07/24/14 12:00 AM  Result Value Ref Range   Triglycerides 69 40 - 160 mg/dL   Cholesterol 90 0 - 295 mg/dL   HDL 21 (A) 35 - 70 mg/dL   LDL Cholesterol 55 mg/dL  Hepatic function panel     Status: None   Collection Time: 07/24/14 12:00 AM  Result Value Ref Range   Alkaline Phosphatase 113 25 - 125 U/L   ALT 21 10 - 40 U/L   AST Albumin  18 3.9 14 - 40 U/L  Hemoglobin A1c     Status: None   Collection Time: 07/24/14 12:00 AM  Result Value Ref Range   Hgb A1c MFr Bld 5.6 4.0 - 6.0 %  Lipid panel     Status: Abnormal   Collection Time: 08/28/14 12:00 AM  Result Value Ref Range   Triglycerides 69 40 - 160 mg/dL   Cholesterol 90 0 - 621 mg/dL   HDL 21 (A) 35 - 70 mg/dL   LDL Cholesterol 55 mg/dL  Urine microalbumin/creatinine ratio 11.4 (08/29/14)  No results found.   Assessment/Plan   ICD-9-CM ICD-10-CM   1. Pain in left extremity, unspecified extremity 729.5 M79.609    upper and lower extremity - uncontrolled; due to #3  2. Muscle spasticity -  stable; due to #3 728.85 M62.49   3. Hemiparesis affecting left side as late effect of stroke - stable 438.20 I69.854   4. Essential hypertension - stable 401.9 I10   5. Diabetes mellitus without complication - controlled 250.00 E11.9   6. Aortic stenosis - stable 424.1 I35.0   7. Hyperlipidemia - controlled 272.4 E78.5   8. Ischemic cardiomyopathy - stable 414.8 I25.5     --change tylenol to 1000mg  qAM and qHS. May take additional 1000mg  daily prn mod-sev pain  --cont other meds as ordered  --PT/ST as ordered. OT as indicated  --cont daily CBG fasting  --will follow  Ashea Winiarski S. Ancil Linsey  [facility] [address] [place], [place] [address] [phone] Cell (Monday-Friday 8 AM - 5 PM) [phone] After 5 PM and follow prompts

## 2014-09-30 ENCOUNTER — Ambulatory Visit (HOSPITAL_COMMUNITY)
Admission: RE | Admit: 2014-09-30 | Discharge: 2014-09-30 | Disposition: A | Discharge status: Home / Self Care | Payer: Medicare Other | Source: Ambulatory Visit | Attending: Internal Medicine | Admitting: Internal Medicine

## 2014-09-30 DIAGNOSIS — R1314 Dysphagia, pharyngoesophageal phase: Secondary | ICD-10-CM

## 2014-09-30 DIAGNOSIS — R1310 Dysphagia, unspecified: Secondary | ICD-10-CM | POA: Diagnosis present

## 2014-09-30 NOTE — Procedures (Signed)
Objective Swallowing Evaluation:    Patient Details  Name: Dylan Malone MRN: [id number] Date of Birth: June 02, 1945  Today's Date: 09/30/2014 Time: SLP Start Time (ACUTE ONLY): 1330-SLP Stop Time (ACUTE ONLY): 1400 SLP Time Calculation (min) (ACUTE ONLY): 30 min  Past Medical History:  Past Medical History  Diagnosis Date  . Stroke     a. wheelchair bound and swallowing difficulties  . Hypertension   . Diabetes mellitus without complication   . Ischemic cardiomyopathy     a. 03/01/14: 2D ECHO w/ LVEF 35-40%, mod LVH, inferosept/inf hypokinesis,  . CAD (coronary artery disease)     a. 03/04/14 [place] s/p DES to mLAD and DES to [facility]  . Aortic stenosis     a. 03/01/14 ECHO moderate with AVA 1.1, Pk grad 32.  Marland Kitchen HLD (hyperlipidemia)    Past Surgical History:  Past Surgical History  Procedure Laterality Date  . Left heart catheterization with coronary angiogram N/A 03/03/2014    Procedure: LEFT HEART CATHETERIZATION WITH CORONARY ANGIOGRAM;  Surgeon: Kathleene Hazel, MD;  Location: [facility] CATH LAB;  Service: Cardiovascular;  Laterality: N/A;  . Percutaneous coronary stent intervention (pci-s) N/A 03/04/2014    Procedure: PERCUTANEOUS CORONARY STENT INTERVENTION (PCI-S);  Surgeon: Marykay Lex, MD;  Location: [facility] CATH LAB;  Service: Cardiovascular;  Laterality: N/A;   HPI:  Other Pertinent Information: Pt referred for OP MBS due to concerns he may be aspirating.  PMH + for CVA with left Hemiparesis, HLD, gout, HTN, CAD, DM, aortic stenosis, depression.   Per referring SLP and pt, pt has been coughing with intake and has congestion at night -  x six months.  Multiple CXRs *mobile* have been negative.  Current diet at facility is puree/nectar thickened liquids. Per SNF SLP, pt no longer eats in dining room due to his difficulties with coughing.    No Data Recorded  Assessment / Plan / Recommendation CHL IP CLINICAL IMPRESSIONS 09/30/2014  Therapy Diagnosis Moderate oral phase dysphagia;Mild oral  phase dysphagia;Mild pharyngeal phase dysphagia  Clinical Impression  Patient presents with moderate oropharyngeal dysphagia with sensorimotor components.  Delayed oral transiting noted due to weakness with premature spillage of barium into pharynx.    Pharyngeal swallow was delayed allowing mild aspiration of thin via tsp, nectar via cup with reflexive significant amount of cough.  Chin tuck posture with straw beneficial to eliminate aspiration/penetration of nectar.  Silent laryngeal penetration of thin via straw ongoing despite chin tuck - however cued cough effective to clear.  Mild amount of vallecular residuals noted without pt awareness, however cued dry swallows helpful.    Using live video, educated pt to findings/recommendations and reinforced effective compensation strategies.    Of note, pt did cough moderately after completion of MBS, causing SLP to suspect component of secretion aspiration.   Recommend dys1/nectar with straw and chin tuck posture.  Also recommend therapeutic feeds of soft/thin hopefully with goal to transition to diet advancement.  Thanks for this referral!       CHL IP TREATMENT RECOMMENDATION 09/30/2014  Treatment Recommendations Defer treatment plan to SLP at (Comment)     CHL IP DIET RECOMMENDATION 09/30/2014  SLP Diet Recommendations Dysphagia 1 (Puree);Nectar  Liquid Administration via (straw)  Medication Administration Whole meds with puree, crush if large and not contraindicated  Compensations Slow rate;Small sips/bites;Chin tuck;Clear throat intermittently  Postural Changes and/or Swallow Maneuvers (None)     CHL IP OTHER RECOMMENDATIONS 09/30/2014  Recommended Consults (None)  Oral Care Recommendations Oral care BID  Other Recommendations (None)         CHL IP REASON FOR REFERRAL 09/30/2014  Reason for Referral Objectively evaluate swallowing function     CHL IP ORAL PHASE 09/30/2014  Oral Phase Impaired      CHL IP PHARYNGEAL PHASE 09/30/2014   Pharyngeal Phase Impaired  Pharyngeal Comment chin tuck posture results in laryngeal penetration but not aspiration, cued cough/throat clear removed penetrates, pt with cough response during aspiration episodes fortunately - however unable to fully clear      CHL IP CERVICAL ESOPHAGEAL PHASE 09/30/2014  Cervical Esophageal Phase WFL    CHL IP GO 09/30/2014  Functional Assessment Tool Used MBS, clinical judgement  Functional Limitations Swallowing  Swallow Current Status (N5621) CK  Swallow Goal Status (H0865) CK  Swallow Discharge Status (H8469) CK           Donavan Burnet, MS [facility] SLP [phone]

## 2014-10-24 ENCOUNTER — Encounter: Payer: Self-pay | Admitting: Adult Health

## 2014-10-24 NOTE — Progress Notes (Signed)
Patient ID: Dylan Malone, male   DOB: 04/21/1945, 69 y.o.   MRN: [id number]   Facility: [facility]      Allergies  Allergen Reactions  . Ace Inhibitors     Chief Complaint  Patient presents with  . Medical Management of Chronic Issues    HPI:  He is a long term resident of this facility being seen for the management of his chronic illnesses.  Overall his status remains stable.  He does have spasticity present. His current weight is 158 pounds.    Past Medical History  Diagnosis Date  . Stroke     a. wheelchair bound and swallowing difficulties  . Hypertension   . Diabetes mellitus without complication   . Ischemic cardiomyopathy     a. 03/01/14: 2D ECHO w/ LVEF 35-40%, mod LVH, inferosept/inf hypokinesis,  . CAD (coronary artery disease)     a. 03/04/14 [place] s/p DES to mLAD and DES to [facility]  . Aortic stenosis     a. 03/01/14 ECHO moderate with AVA 1.1, Pk grad 32.  Marland Kitchen HLD (hyperlipidemia)     Past Surgical History  Procedure Laterality Date  . Left heart catheterization with coronary angiogram N/A 03/03/2014    Procedure: LEFT HEART CATHETERIZATION WITH CORONARY ANGIOGRAM;  Surgeon: Kathleene Hazel, MD;  Location: [facility] CATH LAB;  Service: Cardiovascular;  Laterality: N/A;  . Percutaneous coronary stent intervention (pci-s) N/A 03/04/2014    Procedure: PERCUTANEOUS CORONARY STENT INTERVENTION (PCI-S);  Surgeon: Marykay Lex, MD;  Location: [facility] CATH LAB;  Service: Cardiovascular;  Laterality: N/A;    VITAL SIGNS BP 128/68 mmHg  Pulse 65  Ht  (1.727 m)  Wt 158 lb (71.668 kg)  BMI 24.03 kg/m2  SpO2 98%  Patient's Medications  New Prescriptions   No medications on file  Previous Medications   ALLOPURINOL (ZYLOPRIM) 300 MG TABLET    Take 300 mg by mouth daily.   ASPIRIN EC 81 MG TABLET    Take 81 mg by mouth daily.   ATORVASTATIN (LIPITOR) 80 MG TABLET    Take 1 tablet (80 mg total) by mouth daily at 6 PM.   BACLOFEN (LIORESAL) 10 MG TABLET    Take  10 mg by mouth 3 (three) times daily.    BENZONATATE (TESSALON) 100 MG CAPSULE    Take by mouth 2 (two) times daily as needed for cough.   CARVEDILOL (COREG) 6.25 MG TABLET    Take 1 tablet (6.25 mg total) by mouth 2 (two) times daily with a meal.   CLOPIDOGREL (PLAVIX) 75 MG TABLET    Take 1 tablet (75 mg total) by mouth daily.   HYDRALAZINE (APRESOLINE) 25 MG TABLET    Take 1 tablet (25 mg total) by mouth every 8 (eight) hours.   ISOSORBIDE MONONITRATE (IMDUR) 30 MG 24 HR TABLET    Take 1 tablet (30 mg total) by mouth daily.   LOSARTAN (COZAAR) 25 MG TABLET    Take 1 tablet (25 mg total) by mouth daily.   NITROGLYCERIN (NITROSTAT) 0.4 MG SL TABLET    Place 1 tablet (0.4 mg total) under the tongue every 5 (five) minutes x 3 doses as needed for chest pain.   PROPANTHELINE (PROBANTHINE) 15 MG TABLET    Take 15 mg by mouth 3 (three) times daily with meals.   SERTRALINE (ZOLOFT) 100 MG TABLET    Take 150 mg by mouth at bedtime.    VITAMIN D, ERGOCALCIFEROL, (DRISDOL) 50000 UNITS CAPS CAPSULE  Take 50,000 Units by mouth every 7 (seven) days.  Modified Medications   No medications on file  Discontinued Medications   No medications on file     SIGNIFICANT DIAGNOSTIC EXAMS  LABS REVIEWED:   02-28-14: hgb a1c 5.4 03-01-14: chol 205; ldl 150; trig 150; hdl 25 9-60-45: wbc 8.9; hgb 10.1; hct 30.4; mcv 83.7; plt 200 glucose 98; bun 14; creat 0.75; k+3.6; na++139  03-22-14: tsh 4.7; vit d 12.31 03-25-14: urine culture: no growth 07-24-14: wbc 10.7;  hgb 10.6; hct 34.5; mcv 85.3; plt 218; glucose 94; bun 15.8; creat 0.54; k+4.2; na++143; liver normal albumin 3.9; hgb a1c 5.8; vit d 26.33     Review of Systems Constitutional: Negative for malaise/fatigue.  Respiratory: Negative for  shortness of breath. Has cough with meals   Cardiovascular: Negative for chest pain, palpitations and leg swelling.  Gastrointestinal: Negative for abdominal pain and constipation.  Musculoskeletal: negative for myalgias  . Negative for joint pain.  Skin: Negative.   Neurological: Negative for headaches.  Psychiatric/Behavioral: The patient is not nervous/anxious.     Physical Exam Constitutional: He appears well-developed and well-nourished. No distress.  Neck: Neck supple. No JVD present. No thyromegaly present.  Bilateral carotid bruits   Cardiovascular: Normal rate, regular rhythm and intact distal pulses.   Murmur heard. Respiratory: Effort normal and breath sounds normal. No respiratory distress.  GI: Soft. Bowel sounds are normal. He exhibits no distension.  Musculoskeletal: He exhibits no edema.  Has no movement to left upper extremity Has some movement to left lower extremity Able to move right extremities   Neurological: He is alert.  Skin: Skin is warm and dry. He is not diaphoretic.     ASSESSMENT/ PLAN:  1. Gout: no recent flares present will continue allopurinol 300 mg daily   2. Dyslipidemia: will continue lipitor 80 mg daily; ldl is 150   3. Hypertension: will  coreg 6.25 mg twice daily and asa 81 mg daily and will continue  hydralazine  25 mg three times daily  will continue cozaar 25 mg daily and will monitor   4. CAD: no complaints of chest pain present; will continue asa 81 mg dialy ; imdur 30 mg daily; plavix 75 mg daily and ntg prn  5. CVA with left hemiparesis: neurologically he remains stable;  will continue asa 81 mg daily; plavix 75 mg daily will increase baclofen to 10 mg four times daily for spasticity will monitor. He is  Having coughing with meals; will have speech therapy evaluate and treat as indicated.   6. Depression: will continue zoloft 150 mg nightly   7. Vit d deficiency: will continue  vit d 50,000 units weekly  Last vit d level 26.33   8. Diabetes: he is presently not on medications; does take arb; asa and statin. hgb a1c is 5.8    Will check lipids; urine for micro-albumin    Synthia Innocent NP [facility] Adult [facility]  Contact [phone] Monday  through Friday 8am- 5pm  After hours call [phone]

## 2014-10-30 ENCOUNTER — Non-Acute Institutional Stay (SKILLED_NURSING_FACILITY): Payer: Medicare Other | Admitting: Adult Health

## 2014-10-30 DIAGNOSIS — E119 Type 2 diabetes mellitus without complications: Secondary | ICD-10-CM

## 2014-10-30 DIAGNOSIS — I25110 Atherosclerotic heart disease of native coronary artery with unstable angina pectoris: Secondary | ICD-10-CM

## 2014-10-30 DIAGNOSIS — E785 Hyperlipidemia, unspecified: Secondary | ICD-10-CM

## 2014-10-30 DIAGNOSIS — F32A Depression, unspecified: Secondary | ICD-10-CM

## 2014-10-30 DIAGNOSIS — I639 Cerebral infarction, unspecified: Secondary | ICD-10-CM | POA: Diagnosis not present

## 2014-10-30 DIAGNOSIS — I69354 Hemiplegia and hemiparesis following cerebral infarction affecting left non-dominant side: Secondary | ICD-10-CM

## 2014-10-30 DIAGNOSIS — R1314 Dysphagia, pharyngoesophageal phase: Secondary | ICD-10-CM

## 2014-10-30 DIAGNOSIS — I69854 Hemiplegia and hemiparesis following other cerebrovascular disease affecting left non-dominant side: Secondary | ICD-10-CM

## 2014-10-30 DIAGNOSIS — I10 Essential (primary) hypertension: Secondary | ICD-10-CM | POA: Diagnosis not present

## 2014-10-30 DIAGNOSIS — M1A9XX0 Chronic gout, unspecified, without tophus (tophi): Secondary | ICD-10-CM | POA: Diagnosis not present

## 2014-10-30 DIAGNOSIS — F329 Major depressive disorder, single episode, unspecified: Secondary | ICD-10-CM | POA: Diagnosis not present

## 2014-11-19 ENCOUNTER — Encounter: Payer: Self-pay | Admitting: Adult Health

## 2014-11-19 DIAGNOSIS — R1314 Dysphagia, pharyngoesophageal phase: Secondary | ICD-10-CM | POA: Insufficient documentation

## 2014-11-19 NOTE — Progress Notes (Signed)
Patient ID: Dylan Malone, male   DOB: Jun 26, 1945, 69 y.o.   MRN: [id number]    Facility: [facility]      Allergies  Allergen Reactions  . Ace Inhibitors     Chief Complaint  Patient presents with  . Medical Management of Chronic Issues    HPI:  He is a long term resident of this facility being seen for the management of his chronic illnesses.  His drains are out and at this time; there are no plans for further interventions. He is not voicing any complaints or concerns at this time. There are no nursing concerns at this time.    Past Medical History  Diagnosis Date  . Stroke     a. wheelchair bound and swallowing difficulties  . Hypertension   . Diabetes mellitus without complication   . Ischemic cardiomyopathy     a. 03/01/14: 2D ECHO w/ LVEF 35-40%, mod LVH, inferosept/inf hypokinesis,  . CAD (coronary artery disease)     a. 03/04/14 [place] s/p DES to mLAD and DES to [facility]  . Aortic stenosis     a. 03/01/14 ECHO moderate with AVA 1.1, Pk grad 32.  Marland Kitchen HLD (hyperlipidemia)     Past Surgical History  Procedure Laterality Date  . Left heart catheterization with coronary angiogram N/A 03/03/2014    Procedure: LEFT HEART CATHETERIZATION WITH CORONARY ANGIOGRAM;  Surgeon: Kathleene Hazel, MD;  Location: [facility] CATH LAB;  Service: Cardiovascular;  Laterality: N/A;  . Percutaneous coronary stent intervention (pci-s) N/A 03/04/2014    Procedure: PERCUTANEOUS CORONARY STENT INTERVENTION (PCI-S);  Surgeon: Marykay Lex, MD;  Location: [facility] CATH LAB;  Service: Cardiovascular;  Laterality: N/A;    VITAL SIGNS BP 138/79 mmHg  Pulse 67  Ht  (1.727 m)  Wt 161 lb (73.029 kg)  BMI 24.49 kg/m2  SpO2 94%  Patient's Medications  New Prescriptions   No medications on file  Previous Medications   ACETAMINOPHEN (TYLENOL) 500 MG TABLET    Take 1,000 mg by mouth 2 (two) times daily. And daily as needed   ALLOPURINOL (ZYLOPRIM) 300 MG TABLET    Take 300 mg by mouth daily.   ASPIRIN EC 81 MG TABLET    Take 81 mg by mouth daily.   ATORVASTATIN (LIPITOR) 80 MG TABLET    Take 1 tablet (80 mg total) by mouth daily at 6 PM.   BACLOFEN (LIORESAL) 10 MG TABLET    Take 10 mg by mouth 4 times daily.    BENZONATATE (TESSALON) 100 MG CAPSULE    Take by mouth 2 (two) times daily as needed for cough.   CARVEDILOL (COREG) 6.25 MG TABLET    Take 1 tablet (6.25 mg total) by mouth 2 (two) times daily with a meal.   CLOPIDOGREL (PLAVIX) 75 MG TABLET    Take 1 tablet (75 mg total) by mouth daily.   HYDRALAZINE (APRESOLINE) 25 MG TABLET    Take 1 tablet (25 mg total) by mouth every 8 (eight) hours.   ISOSORBIDE MONONITRATE (IMDUR) 30 MG 24 HR TABLET    Take 1 tablet (30 mg total) by mouth daily.   LOSARTAN (COZAAR) 25 MG TABLET    Take 1 tablet (25 mg total) by mouth daily.   NITROGLYCERIN (NITROSTAT) 0.4 MG SL TABLET    Place 1 tablet (0.4 mg total) under the tongue every 5 (five) minutes x 3 doses as needed for chest pain.   PROPANTHELINE (PROBANTHINE) 15 MG TABLET    Take  15 mg by mouth 3 (three) times daily with meals.   SERTRALINE (ZOLOFT) 100 MG TABLET    Take 150 mg by mouth at bedtime.    VITAMIN D, ERGOCALCIFEROL, (DRISDOL) 50000 UNITS CAPS CAPSULE    Take 50,000 Units by mouth every 7 (seven) days.  Modified Medications   No medications on file  Discontinued Medications   No medications on file     SIGNIFICANT DIAGNOSTIC EXAMS   09-30-14: swallow study: Intermittent laryngeal penetration and frank tracheal aspiration with preserved cough reflex with thin and nectar thick liquids, improved with chin tuck.     LABS REVIEWED:   02-28-14: hgb a1c 5.4 03-01-14: chol 205; ldl 150; trig 150; hdl 25 0-10-27: wbc 8.9; hgb 10.1; hct 30.4; mcv 83.7; plt 200 glucose 98; bun 14; creat 0.75; k+3.6; na++139  03-22-14: tsh 4.7; vit d 12.31 03-25-14: urine culture: no growth 07-24-14: wbc 10.7;  hgb 10.6; hct 34.5; mcv 85.3; plt 218; glucose 94; bun 15.8; creat 0.54; k+4.2; na++143; liver  normal albumin 3.9; hgb a1c 5.8; vit d 26.33 08-28-14: chol 90; ldl 55; trig 69; hdl 21 08-29-14: urine micro-albumin 1.2    Review of Systems Constitutional: Negative for malaise/fatigue.  Respiratory: Negative for  shortness of breath.    Cardiovascular: Negative for chest pain, palpitations and leg swelling.  Gastrointestinal: Negative for abdominal pain and constipation.  Musculoskeletal: negative for myalgias . Negative for joint pain.  Skin: Negative.   Neurological: Negative for headaches.  Psychiatric/Behavioral: The patient is not nervous/anxious.       Physical Exam Constitutional: He appears well-developed and well-nourished. No distress.  Neck: Neck supple. No JVD present. No thyromegaly present.  Bilateral carotid bruits   Cardiovascular: Normal rate, regular rhythm and intact distal pulses.   Murmur heard. Respiratory: Effort normal and breath sounds normal. No respiratory distress.  GI: Soft. Bowel sounds are normal. He exhibits no distension.  Musculoskeletal: He exhibits no edema.  Has no movement to left upper extremity Has some movement to left lower extremity Able to move right extremities   Neurological: He is alert.  Skin: Skin is warm and dry. He is not diaphoretic.      ASSESSMENT/ PLAN:  1. Gout: no recent flares present will continue allopurinol 300 mg daily   2. Dyslipidemia: will continue lipitor 80 mg daily; ldl is 55  3. Hypertension: will  coreg 6.25 mg twice daily and asa 81 mg daily and will continue  hydralazine  25 mg three times daily  will continue cozaar 25 mg daily and will monitor   4. CAD: no complaints of chest pain present; will continue asa 81 mg dialy ; imdur 30 mg daily; plavix 75 mg daily and ntg prn  5. CVA with left hemiparesis: neurologically he remains stable;  will continue asa 81 mg daily; plavix 75 mg daily will continue  baclofen  10 mg four times daily for spasticity will monitor.   6. Depression: will continue zoloft  150 mg nightly   7. Vit d deficiency: will continue  vit d 50,000 units weekly  Last vit d level 26.33   8. Diabetes: he is presently not on medications; does take arb; asa and statin. hgb a1c is 5.8 his micro-albumin is 1.2   9. Dysphagia: is on nectar thick liquids; no signs of aspiration present; will not make changes will monitor      Synthia Innocent NP [facility] Adult [facility]  Contact [phone] Monday through Friday 8am- 5pm  After hours call  [phone]   

## 2014-12-02 ENCOUNTER — Non-Acute Institutional Stay (SKILLED_NURSING_FACILITY): Payer: Medicare Other | Admitting: Adult Health

## 2014-12-02 DIAGNOSIS — I25110 Atherosclerotic heart disease of native coronary artery with unstable angina pectoris: Secondary | ICD-10-CM

## 2014-12-02 DIAGNOSIS — E785 Hyperlipidemia, unspecified: Secondary | ICD-10-CM

## 2014-12-02 DIAGNOSIS — I69354 Hemiplegia and hemiparesis following cerebral infarction affecting left non-dominant side: Secondary | ICD-10-CM

## 2014-12-02 DIAGNOSIS — I119 Hypertensive heart disease without heart failure: Secondary | ICD-10-CM

## 2014-12-02 DIAGNOSIS — F329 Major depressive disorder, single episode, unspecified: Secondary | ICD-10-CM | POA: Diagnosis not present

## 2014-12-02 DIAGNOSIS — E119 Type 2 diabetes mellitus without complications: Secondary | ICD-10-CM | POA: Diagnosis not present

## 2014-12-02 DIAGNOSIS — M1A9XX0 Chronic gout, unspecified, without tophus (tophi): Secondary | ICD-10-CM

## 2014-12-02 DIAGNOSIS — R1314 Dysphagia, pharyngoesophageal phase: Secondary | ICD-10-CM | POA: Diagnosis not present

## 2014-12-02 DIAGNOSIS — I639 Cerebral infarction, unspecified: Secondary | ICD-10-CM | POA: Diagnosis not present

## 2014-12-02 DIAGNOSIS — F32A Depression, unspecified: Secondary | ICD-10-CM

## 2014-12-20 ENCOUNTER — Encounter: Payer: Self-pay | Admitting: Adult Health

## 2014-12-20 DIAGNOSIS — I119 Hypertensive heart disease without heart failure: Secondary | ICD-10-CM | POA: Insufficient documentation

## 2014-12-20 NOTE — Progress Notes (Signed)
Patient ID: Dylan SpanielJose Rosenburg, male   DOB: [birth year], 69 y.o.   MRN: [id number]    [facility]      Allergies  Allergen Reactions  . Ace Inhibitors     Chief Complaint  Patient presents with  . Medical Management of Chronic Issues    HPI:  He is a long term resident of this facility being seen for the management of his chronic illnesses. His status remains stable. He is not voicing any concerns or complaints today stating he is feeling good. There are no nursing concerns being voicing today.     Past Medical History  Diagnosis Date  . Stroke [facility](HCC)     a. wheelchair bound and swallowing difficulties  . Hypertension   . Diabetes mellitus without complication (HCC)   . Ischemic cardiomyopathy     a. 03/01/14: 2D ECHO w/ LVEF 35-40%, mod LVH, inferosept/inf hypokinesis,  . CAD (coronary artery disease)     a. 03[place]04/14 [place] [facility]  . Aortic stenosis     a. 03/01/14 ECHO moderate with AVA 1.1, Pk grad 32.  Marland Kitchen. HLD (hyperlipidemia)     Past Surgical History  Procedure Laterality Date  . Left heart catheterization with coronary angiogram N/A 03/03/2014    Procedure: LEFT HEART CATHETERIZATION WITH CORONARY ANGIOGRAM;  Surgeon: Kathleene Hazelhristopher D McAlhany, MD;  [facility];  Service: Cardiovascular;  Laterality: N/A;  . Percutaneous coronary stent intervention (pci-s) N/A 03/04/2014    Procedure: PERCUTANEOUS CORONARY STENT INTERVENTION (PCI-S);  Surgeon: Marykay Lexavid W Harding, MD;  [facility];  Service: Cardiovascular;  Laterality: N/A;    VITAL SIGNS BP 132/79 mmHg  Pulse 69  Ht 5\' 8"  (1.727 m)  Wt 161 lb (73.029 kg)  BMI 24.49 kg/m2  Patient's Medications  New Prescriptions   No medications on file  Previous Medications   ACETAMINOPHEN (TYLENOL) 500 MG TABLET    Take 1,000 mg by mouth 2 (two) times daily. And daily as needed   ALLOPURINOL (ZYLOPRIM) 300 MG TABLET    Take 300 mg by mouth daily.   ASPIRIN EC 81 MG TABLET     Take 81 mg by mouth daily.   ATORVASTATIN (LIPITOR) 80 MG TABLET    Take 1 tablet (80 mg total) by mouth daily at 6 PM.   BACLOFEN (LIORESAL) 10 MG TABLET    Take 10 mg by mouth 3 (three) times daily.    BENZONATATE (TESSALON) 100 MG CAPSULE    Take by mouth 2 (two) times daily as needed for cough.   CARVEDILOL (COREG) 6.25 MG TABLET    Take 1 tablet (6.25 mg total) by mouth 2 (two) times daily with a meal.   CLOPIDOGREL (PLAVIX) 75 MG TABLET    Take 1 tablet (75 mg total) by mouth daily.   FOOD THICKENER (THICK IT) POWD    Take 1 Container by mouth as needed. Nectar thick   HYDRALAZINE (APRESOLINE) 25 MG TABLET    Take 1 tablet (25 mg total) by mouth every 8 (eight) hours.   ISOSORBIDE MONONITRATE (IMDUR) 30 MG 24 HR TABLET    Take 1 tablet (30 mg total) by mouth daily.   LOSARTAN (COZAAR) 25 MG TABLET    Take 1 tablet (25 mg total) by mouth daily.   NITROGLYCERIN (NITROSTAT) 0.4 MG SL TABLET    Place 1 tablet (0.4 mg total) under the tongue every 5 (five) minutes x 3 doses as needed for chest  pain.   OMEPRAZOLE (PRILOSEC) 20 MG CAPSULE    Take 20 mg by mouth daily.   SERTRALINE (ZOLOFT) 100 MG TABLET    Take 150 mg by mouth at bedtime.    VITAMIN D, ERGOCALCIFEROL, (DRISDOL) 50000 UNITS CAPS CAPSULE    Take 50,000 Units by mouth every 7 (seven) days.  Modified Medications   No medications on file  Discontinued Medications   PROPANTHELINE (PROBANTHINE) 15 MG TABLET    Take 15 mg by mouth 3 (three) times daily with meals.     SIGNIFICANT DIAGNOSTIC EXAMS    09-30-14: swallow study: Intermittent laryngeal penetration and frank tracheal aspiration with preserved cough reflex with thin and nectar thick liquids, improved with chin tuck.     LABS REVIEWED:   02-28-14: hgb a1c 5.4 03-01-14: chol 205; ldl 150; trig 150; hdl 25 7-82-95: wbc 8.9; hgb 10.1; hct 30.4; mcv 83.7; plt 200 glucose 98; bun 14; creat 0.75; k+3.6; na++139  03-22-14: tsh 4.7; vit d 12.31 03-25-14: urine culture: no  growth 07-24-14: wbc 10.7;  hgb 10.6; hct 34.5; mcv 85.3; plt 218; glucose 94; bun 15.8; creat 0.54; k+4.2; na++143; liver normal albumin 3.9; hgb a1c 5.8; vit d 26.33 08-28-14: chol 90; ldl 55; trig 69; hdl 21 08-29-14: urine micro-albumin 1.2  10-31-14: hgb a1c 5.9       Review of Systems Constitutional: Negative for malaise/fatigue.  Respiratory: Negative for  shortness of breath.    Cardiovascular: Negative for chest pain, palpitations and leg swelling.  Gastrointestinal: Negative for abdominal pain and constipation.  Musculoskeletal: negative for myalgias . Negative for joint pain.  Skin: Negative.   Neurological: Negative for headaches.  Psychiatric/Behavioral: The patient is not nervous/anxious.     Physical Exam Constitutional: He appears well-developed and well-nourished. No distress.  Neck: Neck supple. No JVD present. No thyromegaly present.  Bilateral carotid bruits   Cardiovascular: Normal rate, regular rhythm and intact distal pulses.   Murmur heard. Respiratory: Effort normal and breath sounds normal. No respiratory distress.  GI: Soft. Bowel sounds are normal. He exhibits no distension.  Musculoskeletal: He exhibits no edema.  Has no movement to left upper extremity Has some movement to left lower extremity Able to move right extremities   Neurological: He is alert.  Skin: Skin is warm and dry. He is not diaphoretic.      ASSESSMENT/ PLAN:  1. Gout: no recent flares present will continue allopurinol 300 mg daily   2. Dyslipidemia: will continue lipitor 80 mg daily; ldl is 55  3. Hypertension: will  coreg 6.25 mg twice daily and asa 81 mg daily and will continue  hydralazine  25 mg three times daily  will continue cozaar 25 mg daily and will monitor   4. CAD: no complaints of chest pain present; will continue asa 81 mg dialy ; imdur 30 mg daily; plavix 75 mg daily and ntg prn  5. CVA with left hemiparesis: neurologically he remains stable;  will continue asa 81  mg daily; plavix 75 mg daily will continue  baclofen  10 mg four times daily for spasticity will monitor.   6. Depression: will continue zoloft 150 mg nightly   7. Vit d deficiency: will continue  vit d 50,000 units weekly  Last vit d level 26.33   8. Diabetes: he is presently not on medications; does take arb; asa and statin. hgb a1c is 5.9 his micro-albumin is 1.2   9. Dysphagia: is on nectar thick liquids; no signs of aspiration present;  will not make changes will monitor   10. GERD: will continue prilosec 20 mg daily        Synthia Innocent NP [facility] Adult [facility]  Contact [phone] Monday through Friday 8am- 5pm  After hours call [phone]

## 2015-01-05 ENCOUNTER — Encounter: Payer: Self-pay | Admitting: Internal Medicine

## 2015-01-05 ENCOUNTER — Non-Acute Institutional Stay (SKILLED_NURSING_FACILITY): Payer: Medicare Other | Admitting: Internal Medicine

## 2015-01-05 DIAGNOSIS — M62838 Other muscle spasm: Secondary | ICD-10-CM

## 2015-01-05 DIAGNOSIS — I69354 Hemiplegia and hemiparesis following cerebral infarction affecting left non-dominant side: Secondary | ICD-10-CM | POA: Diagnosis not present

## 2015-01-05 DIAGNOSIS — I10 Essential (primary) hypertension: Secondary | ICD-10-CM

## 2015-01-05 DIAGNOSIS — F32A Depression, unspecified: Secondary | ICD-10-CM

## 2015-01-05 DIAGNOSIS — M6249 Contracture of muscle, multiple sites: Secondary | ICD-10-CM

## 2015-01-05 DIAGNOSIS — F329 Major depressive disorder, single episode, unspecified: Secondary | ICD-10-CM | POA: Diagnosis not present

## 2015-01-05 NOTE — Assessment & Plan Note (Signed)
Wonder if c/o pain LLE  could be spasticity with LLE; will increase baclofen to 20 mg QID and monitor; he has tylenol, trying not to start pain meds

## 2015-01-05 NOTE — Assessment & Plan Note (Signed)
Not well controlled today;Plan - will inc hydralazine to 50 mg TID and continue same other meds -coreg 6.25 BID and cozaar 25mg  daily

## 2015-01-05 NOTE — Progress Notes (Signed)
MRN: [id number] Name: Dylan Malone  Sex: male Age: 69 y.o. DOB: [birth year]  PSC #: [name] Facility/Room:102 Level Of Care: SNF Provider: [name] Emergency Contacts: Extended Emergency Contact Information Primary Emergency Contact: Stroschein,Jomayra Address: [address]          [place], [place] [address] Darden Amber of [place] Home Phone: [phone] Relation: Daughter  Code Status:   Allergies: Ace inhibitors  Chief Complaint  Patient presents with  . Medical Management of Chronic Issues    HPI: Patient is 69 y.o. male with s/p stroke, HTN, CAD, HLD, CHF who is being seen for routine issues of HTN, s/p CVA with L hemiparesis and spasticity and depression.  Past Medical History  Diagnosis Date  . Stroke [facility])     a. wheelchair bound and swallowing difficulties  . Hypertension   . Diabetes mellitus without complication (HCC)   . Ischemic cardiomyopathy     a. 03/01/14: 2D ECHO w/ LVEF 35-40%, mod LVH, inferosept/inf hypokinesis,  . CAD (coronary artery disease)     a. 03/04/14 [place] s/p DES to mLAD and DES to [facility]  . Aortic stenosis     a. 03/01/14 ECHO moderate with AVA 1.1, Pk grad 32.  Marland Kitchen HLD (hyperlipidemia)     Past Surgical History  Procedure Laterality Date  . Left heart catheterization with coronary angiogram N/A 03/03/2014    Procedure: LEFT HEART CATHETERIZATION WITH CORONARY ANGIOGRAM;  Surgeon: Kathleene Hazel, MD;  Location: [facility] CATH LAB;  Service: Cardiovascular;  Laterality: N/A;  . Percutaneous coronary stent intervention (pci-s) N/A 03/04/2014    Procedure: PERCUTANEOUS CORONARY STENT INTERVENTION (PCI-S);  Surgeon: Marykay Lex, MD;  Location: [facility] CATH LAB;  Service: Cardiovascular;  Laterality: N/A;      Medication List       This list is accurate as of: 01/05/15 12:20 PM.  Always use your most recent med list.               acetaminophen 500 MG tablet  Commonly known as:  TYLENOL  Take 1,000 mg by mouth 2 (two) times  daily. And daily as needed     allopurinol 300 MG tablet  Commonly known as:  ZYLOPRIM  Take 300 mg by mouth daily.     aspirin EC 81 MG tablet  Take 81 mg by mouth daily.     atorvastatin 80 MG tablet  Commonly known as:  LIPITOR  Take 1 tablet (80 mg total) by mouth daily at 6 PM.     baclofen 10 MG tablet  Commonly known as:  LIORESAL  Take 10 mg by mouth 3 (three) times daily.     benzonatate 100 MG capsule  Commonly known as:  TESSALON  Take by mouth 2 (two) times daily as needed for cough.     carvedilol 6.25 MG tablet  Commonly known as:  COREG  Take 1 tablet (6.25 mg total) by mouth 2 (two) times daily with a meal.     clopidogrel 75 MG tablet  Commonly known as:  PLAVIX  Take 1 tablet (75 mg total) by mouth daily.     food thickener Powd  Commonly known as:  THICK IT  Take 1 Container by mouth as needed. Nectar thick     hydrALAZINE 25 MG tablet  Commonly known as:  APRESOLINE  Take 1 tablet (25 mg total) by mouth every 8 (eight) hours.     isosorbide mononitrate 30 MG 24 hr tablet  Commonly known as:  IMDUR  Take 1 tablet (30 mg total) by mouth daily.     losartan 25 MG tablet  Commonly known as:  COZAAR  Take 1 tablet (25 mg total) by mouth daily.     nitroGLYCERIN 0.4 MG SL tablet  Commonly known as:  NITROSTAT  Place 1 tablet (0.4 mg total) under the tongue every 5 (five) minutes x 3 doses as needed for chest pain.     omeprazole 20 MG capsule  Commonly known as:  PRILOSEC  Take 20 mg by mouth daily.     sertraline 100 MG tablet  Commonly known as:  ZOLOFT  Take 150 mg by mouth at bedtime.     Vitamin D (Ergocalciferol) 50000 UNITS Caps capsule  Commonly known as:  DRISDOL  Take 50,000 Units by mouth every 7 (seven) days.        No orders of the defined types were placed in this encounter.    Immunization History  Administered Date(s) Administered  . PPD Test 03/05/2014    Social History  Substance Use Topics  . Smoking status:  Never Smoker   . Smokeless tobacco: Not on file  . Alcohol Use: No    Review of Systems  DATA OBTAINED: from patient, nurse GENERAL:  no fevers, fatigue, appetite changes SKIN: No itching, rash HEENT: No complaint RESPIRATORY: No cough, wheezing, SOB CARDIAC: No chest pain, palpitations, lower extremity edema  GI: No abdominal pain, No N/V/D or constipation, No heartburn or reflux  GU: No dysuria, frequency or urgency, or incontinence  MUSCULOSKELETAL: occ LE pain NEUROLOGIC: No headache, dizziness  PSYCHIATRIC: No overt anxiety or sadness  Filed Vitals:   01/05/15 1200  BP: 169/76  Pulse: 69  Temp: 96 F (35.6 C)  Resp: 17    Physical Exam  GENERAL APPEARANCE: Alert, conversant, No acute distress  SKIN: No diaphoresis rash HEENT: Unremarkable RESPIRATORY: Breathing is even, unlabored. Lung sounds are clear   CARDIOVASCULAR: Heart RRR 3/6 murmur, no rubs or gallops. No peripheral edema  GASTROINTESTINAL: Abdomen is soft, non-tender, not distended w/ normal bowel sounds.  GENITOURINARY: Bladder non tender, not distended  MUSCULOSKELETAL: No abnormal joints or musculature NEUROLOGIC: Cranial nerves 2-12 grossly intact; L side weakness PSYCHIATRIC: Mood and affect appropriate to situation, no behavioral issues  Patient Active Problem List   Diagnosis Date Noted  . Benign hypertensive heart disease without heart failure 12/20/2014  . Dysphagia, pharyngoesophageal phase 11/19/2014  . Muscle spasticity 09/25/2014  . Pain in limb 09/25/2014  . Hemiparesis affecting left side as late effect of stroke (HCC) 06/12/2014  . Gout, chronic, without tophus 06/08/2014  . Depression 06/08/2014  . Vitamin D deficiency 06/08/2014  . Essential hypertension 03/09/2014  . History of CVA with residual deficit 03/09/2014  . Aortic stenosis   . CAD (coronary artery disease)   . Ischemic cardiomyopathy   . Diabetes mellitus without complication (HCC)   . Stroke (HCC)   .  Hyperlipidemia 03/01/2014  . NSTEMI (non-ST elevated myocardial infarction) (HCC) 02/28/2014    CBC    Component Value Date/Time   WBC 10.7 07/24/2014   WBC 8.9 03/05/2014 0300   RBC 3.63* 03/05/2014 0300   HGB 10.6* 07/24/2014   HCT 34* 07/24/2014   PLT 218 07/24/2014   MCV 83.7 03/05/2014 0300    CMP     Component Value Date/Time   NA 143 07/24/2014   NA 139 03/05/2014 0300   K 4.2 07/24/2014   CL 108 03/05/2014 0300   CO2 25  03/05/2014 0300   GLUCOSE 98 03/05/2014 0300   BUN 16 07/24/2014   BUN 14 03/05/2014 0300   CREATININE 0.5* 07/24/2014   CREATININE 0.75 03/05/2014 0300   CALCIUM 9.0 03/05/2014 0300   PROT 9.1* 02/28/2014 1653   ALBUMIN 4.7 02/28/2014 1653   AST 18 07/24/2014   ALT 21 07/24/2014   ALKPHOS 113 07/24/2014   BILITOT 0.8 02/28/2014 1653   GFRNONAA >90 03/05/2014 0300   GFRAA >90 03/05/2014 0300    Assessment and Plan  Essential hypertension Not well controlled today;Plan - will inc hydralazine to 50 mg TID and continue same other meds -coreg 6.25 BID and cozaar 25mg  daily  Hemiparesis affecting left side as late effect of stroke Wonder if c/o pain LLE  could be spasticity with LLE; will increase baclofen to 20 mg QID and monitor; he has tylenol, trying not to start pain meds  Muscle spasticity As above , will inc baclofen to 20 mg QID and monitor affect  Depression Appears stable; plan to continue zoloft 150 mg daily    Margit HanksALEXANDER, Truong Delcastillo D, MD

## 2015-01-05 NOTE — Assessment & Plan Note (Signed)
Appears stable; plan to continue zoloft 150 mg daily

## 2015-01-05 NOTE — Assessment & Plan Note (Signed)
As above , will inc baclofen to 20 mg QID and monitor affect

## 2015-01-29 ENCOUNTER — Non-Acute Institutional Stay (SKILLED_NURSING_FACILITY): Payer: Medicare Other | Admitting: Internal Medicine

## 2015-01-29 ENCOUNTER — Encounter: Payer: Self-pay | Admitting: Internal Medicine

## 2015-01-29 DIAGNOSIS — E559 Vitamin D deficiency, unspecified: Secondary | ICD-10-CM | POA: Diagnosis not present

## 2015-01-29 DIAGNOSIS — M1A9XX0 Chronic gout, unspecified, without tophus (tophi): Secondary | ICD-10-CM | POA: Diagnosis not present

## 2015-01-29 DIAGNOSIS — M62838 Other muscle spasm: Secondary | ICD-10-CM

## 2015-01-29 DIAGNOSIS — M6249 Contracture of muscle, multiple sites: Secondary | ICD-10-CM | POA: Diagnosis not present

## 2015-01-29 DIAGNOSIS — I6930 Unspecified sequelae of cerebral infarction: Secondary | ICD-10-CM | POA: Diagnosis not present

## 2015-01-29 DIAGNOSIS — E785 Hyperlipidemia, unspecified: Secondary | ICD-10-CM | POA: Diagnosis not present

## 2015-01-29 NOTE — Assessment & Plan Note (Signed)
Vit D level 26.33; plan - cont 50,000 u weekly ; will recheck in 12 weeks

## 2015-01-29 NOTE — Assessment & Plan Note (Signed)
Chronic and without recent flare;plan - conr allopurinol 300 mg daily

## 2015-01-29 NOTE — Progress Notes (Signed)
MRN: [id number] Name: Dylan Malone  Sex: male Age: 69 y.o. DOB: 01-17-1946  PSC #: [name] Facility/Room:102A Level Of Care: SNF Provider: [name] Emergency Contacts: Extended Emergency Contact Information Primary Emergency Contact: Melman,Jomayra Address: [address]          [place], [place] [address] D[place] of [place] Home Phone: [phone] Relation: Daughter  Code Status:   Allergies: Ace inhibitors  Chief Complaint  Patient presents with  . Medical Management of Chronic Issues    HPI: Patient is 69 y.o. male with s/p stroke, HTN, CAD, HLD, CHF who is being seen for routine issues of HLD, gout , Vit D deficiency and muscle spasticity.  Past Medical History  Diagnosis Date  . Stroke [facility])     a. wheelchair bound and swallowing difficulties  . Hypertension   . Diabetes mellitus without complication (HCC)   . Ischemic cardiomyopathy     a. 03/01/14: 2D ECHO w/ LVEF 35-40%, mod LVH, inferosept/inf hypokinesis,  . CAD (coronary artery disease)     a. 03/04/14 [place] s/p DES to mLAD and DES to [facility]  . Aortic stenosis     a. 03/01/14 ECHO moderate with AVA 1.1, Pk grad 32.  Marland Kitchen HLD (hyperlipidemia)     Past Surgical History  Procedure Laterality Date  . Left heart catheterization with coronary angiogram N/A 03/03/2014    Procedure: LEFT HEART CATHETERIZATION WITH CORONARY ANGIOGRAM;  Surgeon: Kathleene Hazel, MD;  Location: [facility] CATH LAB;  Service: Cardiovascular;  Laterality: N/A;  . Percutaneous coronary stent intervention (pci-s) N/A 03/04/2014    Procedure: PERCUTANEOUS CORONARY STENT INTERVENTION (PCI-S);  Surgeon: Marykay Lex, MD;  Location: [facility] CATH LAB;  Service: Cardiovascular;  Laterality: N/A;      Medication List       This list is accurate as of: 01/29/15  6:48 PM.  Always use your most recent med list.               acetaminophen 500 MG tablet  Commonly known as:  TYLENOL  Take 1,000 mg by mouth 2 (two) times daily. And  daily as needed     allopurinol 300 MG tablet  Commonly known as:  ZYLOPRIM  Take 300 mg by mouth daily.     aspirin EC 81 MG tablet  Take 81 mg by mouth daily.     atorvastatin 80 MG tablet  Commonly known as:  LIPITOR  Take 1 tablet (80 mg total) by mouth daily at 6 PM.     baclofen 10 MG tablet  Commonly known as:  LIORESAL  Take 10 mg by mouth 3 (three) times daily.     benzonatate 100 MG capsule  Commonly known as:  TESSALON  Take by mouth 2 (two) times daily as needed for cough.     carvedilol 6.25 MG tablet  Commonly known as:  COREG  Take 1 tablet (6.25 mg total) by mouth 2 (two) times daily with a meal.     clopidogrel 75 MG tablet  Commonly known as:  PLAVIX  Take 1 tablet (75 mg total) by mouth daily.     food thickener Powd  Commonly known as:  THICK IT  Take 1 Container by mouth as needed. Nectar thick     hydrALAZINE 25 MG tablet  Commonly known as:  APRESOLINE  Take 1 tablet (25 mg total) by mouth every 8 (eight) hours.     isosorbide mononitrate 30 MG 24 hr tablet  Commonly known as:  IMDUR  Take 1 tablet (30 mg total) by mouth daily.     losartan 25 MG tablet  Commonly known as:  COZAAR  Take 1 tablet (25 mg total) by mouth daily.     nitroGLYCERIN 0.4 MG SL tablet  Commonly known as:  NITROSTAT  Place 1 tablet (0.4 mg total) under the tongue every 5 (five) minutes x 3 doses as needed for chest pain.     omeprazole 20 MG capsule  Commonly known as:  PRILOSEC  Take 20 mg by mouth daily.     sertraline 100 MG tablet  Commonly known as:  ZOLOFT  Take 150 mg by mouth at bedtime.     Vitamin D (Ergocalciferol) 50000 UNITS Caps capsule  Commonly known as:  DRISDOL  Take 50,000 Units by mouth every 7 (seven) days.        No orders of the defined types were placed in this encounter.    Immunization History  Administered Date(s) Administered  . PPD Test 03/05/2014    Social History  Substance Use Topics  . Smoking status: Never Smoker    . Smokeless tobacco: Not on file  . Alcohol Use: No    Review of Systems  DATA OBTAINED: from patient, nurse GENERAL:  no fevers, fatigue, appetite changes SKIN: No itching, rash HEENT: No complaint RESPIRATORY: No cough, wheezing, SOB CARDIAC: No chest pain, palpitations, lower extremity edema  GI: No abdominal pain, No N/V/D or constipation, No heartburn or reflux  GU: No dysuria, frequency or urgency, or incontinence  MUSCULOSKELETAL: c/o still pain L hand and L leg NEUROLOGIC: No headache, dizziness  PSYCHIATRIC: No overt anxiety or sadness  Filed Vitals:   01/29/15 1503  BP: 169/76  Pulse: 69  Temp: 97.3 F (36.3 C)  Resp: 17    Physical Exam  GENERAL APPEARANCE: Alert, conversant, No acute distress  SKIN: No diaphoresis rash HEENT: Unremarkable RESPIRATORY: Breathing is even, unlabored. Lung sounds are clear   CARDIOVASCULAR: Heart RRR no murmurs, rubs or gallops. No peripheral edema  GASTROINTESTINAL: Abdomen is soft, non-tender, not distended w/ normal bowel sounds.  GENITOURINARY: Bladder non tender, not distended  MUSCULOSKELETAL: flexion contracture L hand, wasting L leg NEUROLOGIC: Cranial nerves 2-12 grossly intact. Moves all extremities PSYCHIATRIC: Mood and affect appropriate to situation, no behavioral issues  Patient Active Problem List   Diagnosis Date Noted  . Benign hypertensive heart disease without heart failure 12/20/2014  . Dysphagia, pharyngoesophageal phase 11/19/2014  . Muscle spasticity 09/25/2014  . Pain in limb 09/25/2014  . Hemiparesis affecting left side as late effect of stroke (HCC) 06/12/2014  . Gout, chronic, without tophus 06/08/2014  . Depression 06/08/2014  . Vitamin D deficiency 06/08/2014  . Essential hypertension 03/09/2014  . History of CVA with residual deficit 03/09/2014  . Aortic stenosis   . CAD (coronary artery disease)   . Ischemic cardiomyopathy   . Diabetes mellitus without complication (HCC)   . Stroke (HCC)    . Hyperlipidemia 03/01/2014  . NSTEMI (non-ST elevated myocardial infarction) (HCC) 02/28/2014    CBC    Component Value Date/Time   WBC 10.7 07/24/2014   WBC 8.9 03/05/2014 0300   RBC 3.63* 03/05/2014 0300   HGB 10.6* 07/24/2014   HCT 34* 07/24/2014   PLT 218 07/24/2014   MCV 83.7 03/05/2014 0300    CMP     Component Value Date/Time   NA 143 07/24/2014   NA 139 03/05/2014 0300   K 4.2 07/24/2014   CL 108  03/05/2014 0300   CO2 25 03/05/2014 0300   GLUCOSE 98 03/05/2014 0300   BUN 16 07/24/2014   BUN 14 03/05/2014 0300   CREATININE 0.5* 07/24/2014   CREATININE 0.75 03/05/2014 0300   CALCIUM 9.0 03/05/2014 0300   PROT 9.1* 02/28/2014 1653   ALBUMIN 4.7 02/28/2014 1653   AST 18 07/24/2014   ALT 21 07/24/2014   ALKPHOS 113 07/24/2014   BILITOT 0.8 02/28/2014 1653   GFRNONAA >90 03/05/2014 0300   GFRAA >90 03/05/2014 0300    Assessment and Plan  Hyperlipidemia LDL  Is 55 on Lipitor 80 mg;plan - cont current regimen  Gout, chronic, without tophus Chronic and without recent flare;plan - conr allopurinol 300 mg daily  Vitamin D deficiency Vit D level 26.33; plan - cont 50,000 u weekly ; will recheck in 12 weeks  History of CVA with residual deficit    Muscle spasticity Discovered pt was not getting baclofen 20 mg QID as ordered, he was getting it daily;order rewritten; also wrote order for L wrist splint and robaxin 500 mg qHS for leg cramps.     Margit Hanks, MD

## 2015-01-29 NOTE — Assessment & Plan Note (Signed)
LDL  Is 55 on Lipitor 80 mg;plan - cont current regimen

## 2015-01-29 NOTE — Assessment & Plan Note (Signed)
Discovered pt was not getting baclofen 20 mg QID as ordered, he was getting it daily;order rewritten; also wrote order for L wrist splint and robaxin 500 mg qHS for leg cramps.

## 2015-03-03 ENCOUNTER — Non-Acute Institutional Stay (SKILLED_NURSING_FACILITY): Payer: Medicare Other | Admitting: Adult Health

## 2015-03-03 DIAGNOSIS — I25110 Atherosclerotic heart disease of native coronary artery with unstable angina pectoris: Secondary | ICD-10-CM | POA: Diagnosis not present

## 2015-03-03 DIAGNOSIS — R1314 Dysphagia, pharyngoesophageal phase: Secondary | ICD-10-CM

## 2015-03-03 DIAGNOSIS — I69354 Hemiplegia and hemiparesis following cerebral infarction affecting left non-dominant side: Secondary | ICD-10-CM | POA: Diagnosis not present

## 2015-03-03 DIAGNOSIS — E119 Type 2 diabetes mellitus without complications: Secondary | ICD-10-CM | POA: Diagnosis not present

## 2015-03-03 DIAGNOSIS — I119 Hypertensive heart disease without heart failure: Secondary | ICD-10-CM

## 2015-03-03 DIAGNOSIS — M1A9XX0 Chronic gout, unspecified, without tophus (tophi): Secondary | ICD-10-CM

## 2015-03-03 DIAGNOSIS — E559 Vitamin D deficiency, unspecified: Secondary | ICD-10-CM

## 2015-03-03 DIAGNOSIS — F32A Depression, unspecified: Secondary | ICD-10-CM

## 2015-03-03 DIAGNOSIS — E785 Hyperlipidemia, unspecified: Secondary | ICD-10-CM

## 2015-03-03 DIAGNOSIS — F329 Major depressive disorder, single episode, unspecified: Secondary | ICD-10-CM

## 2015-03-03 DIAGNOSIS — I639 Cerebral infarction, unspecified: Secondary | ICD-10-CM

## 2015-03-10 LAB — BASIC METABOLIC PANEL
BUN: 23 mg/dL — AB (ref 4–21)
CREATININE: 0.5 mg/dL — AB (ref 0.6–1.3)
Glucose: 102 mg/dL
Potassium: 3.7 mmol/L (ref 3.4–5.3)
Sodium: 146 mmol/L (ref 137–147)

## 2015-03-10 LAB — LIPID PANEL
CHOLESTEROL: 91 mg/dL (ref 0–200)
HDL: 24 mg/dL — AB (ref 35–70)
LDL Cholesterol: 36 mg/dL
TRIGLYCERIDES: 158 mg/dL (ref 40–160)

## 2015-03-10 LAB — HEPATIC FUNCTION PANEL
ALK PHOS: 130 U/L — AB (ref 25–125)
ALT: 17 U/L (ref 10–40)
AST: 17 U/L (ref 14–40)
Bilirubin, Total: 0.2 mg/dL

## 2015-03-10 LAB — HEMOGLOBIN A1C: Hemoglobin A1C: 5.9

## 2015-03-12 ENCOUNTER — Encounter: Payer: Self-pay | Admitting: Adult Health

## 2015-03-12 NOTE — Progress Notes (Signed)
Patient ID: Dylan Malone, male   DOB: [birth year], 70 y.o.   MRN: [id number]    Facility:  [facility]    PCP Margit Hanks, MD    Allergies  Allergen Reactions  . Ace Inhibitors     Chief Complaint  Patient presents with  . Medical Management of Chronic Issues    HPI:  He is a long term resident of this facility being seen for the management of his chronic illnesses. Overall there is little change in his status.  He does have pain to his stroke affected extremities. His blood pressure has been elevated. He will require medication adjustment for his blood pressure.   Past Medical History  Diagnosis Date  . Stroke [facility])     a. wheelchair bound and swallowing difficulties  . Hypertension   . Diabetes mellitus without complication (HCC)   . Ischemic cardiomyopathy     a. 03/01/14: 2D ECHO w/ LVEF 35-40%, mod LVH, inferosept/inf hypokinesis,  . CAD (coronary artery disease)     a. 03/04/14 [place] s/p DES to mLAD and DES to [facility]  . Aortic stenosis     a. 03/01/14 ECHO moderate with AVA 1.1, Pk grad 32.  Marland Kitchen HLD (hyperlipidemia)     Past Surgical History  Procedure Laterality Date  . Left heart catheterization with coronary angiogram N/A 03/03/2014    Procedure: LEFT HEART CATHETERIZATION WITH CORONARY ANGIOGRAM;  Surgeon: Kathleene Hazel, MD;  Location: [facility] CATH LAB;  Service: Cardiovascular;  Laterality: N/A;  . Percutaneous coronary stent intervention (pci-s) N/A 03/04/2014    Procedure: PERCUTANEOUS CORONARY STENT INTERVENTION (PCI-S);  Surgeon: Marykay Lex, MD;  Location: [facility] CATH LAB;  Service: Cardiovascular;  Laterality: N/A;    VITAL SIGNS BP 182/78 mmHg  Pulse 65  Ht  (1.727 m)  Wt 170 lb (77.111 kg)  BMI 25.85 kg/m2  SpO2 96%  Patient's Medications  New Prescriptions   No medications on file  Previous Medications   ACETAMINOPHEN (TYLENOL) 500 MG TABLET    Take 1,000 mg by mouth 2 (two) times daily. And daily as needed   ALLOPURINOL (ZYLOPRIM) 300  MG TABLET    Take 300 mg by mouth daily.   ASPIRIN EC 81 MG TABLET    Take 81 mg by mouth daily.   ATORVASTATIN (LIPITOR) 80 MG TABLET    Take 1 tablet (80 mg total) by mouth daily at 6 PM.   BACLOFEN (LIORESAL) 20 MG TABLET    Take 20 mg by mouth 4 (four) times daily.   BENZONATATE (TESSALON) 100 MG CAPSULE    Take by mouth 2 (two) times daily as needed for cough.   CARVEDILOL (COREG) 6.25 MG TABLET    Take 1 tablet (6.25 mg total) by mouth 2 (two) times daily with a meal.   CLOPIDOGREL (PLAVIX) 75 MG TABLET    Take 1 tablet (75 mg total) by mouth daily.   FOOD THICKENER (THICK IT) POWD    Take 1 Container by mouth as needed. Nectar thick   HYDRALAZINE (APRESOLINE) 25 MG TABLET    Take 1 tablet (25 mg total) by mouth every 8 (eight) hours.   ISOSORBIDE MONONITRATE (IMDUR) 30 MG 24 HR TABLET    Take 1 tablet (30 mg total) by mouth daily.   LOSARTAN (COZAAR) 25 MG TABLET    Take 1 tablet (25 mg total) by mouth daily.   METHOCARBAMOL (ROBAXIN) 500 MG TABLET    Take 500 mg by mouth at bedtime.  NITROGLYCERIN (NITROSTAT) 0.4 MG SL TABLET    Place 1 tablet (0.4 mg total) under the tongue every 5 (five) minutes x 3 doses as needed for chest pain.   OMEPRAZOLE (PRILOSEC) 20 MG CAPSULE    Take 20 mg by mouth daily.   SERTRALINE (ZOLOFT) 100 MG TABLET    Take 175 mg by mouth at bedtime.    VITAMIN D, ERGOCALCIFEROL, (DRISDOL) 50000 UNITS CAPS CAPSULE    Take 50,000 Units by mouth every 7 (seven) days.  Modified Medications   No medications on file  Discontinued Medications   BACLOFEN (LIORESAL) 10 MG TABLET    Take 10 mg by mouth 3 (three) times daily.      SIGNIFICANT DIAGNOSTIC EXAMS    09-30-14: swallow study: Intermittent laryngeal penetration and frank tracheal aspiration with preserved cough reflex with thin and nectar thick liquids, improved with chin tuck.     LABS REVIEWED:   03-05-14: wbc 8.9; hgb 10.1; hct 30.4; mcv 83.7; plt 200 glucose 98; bun 14; creat 0.75; k+3.6; na++139    03-22-14: tsh 4.7; vit d 12.31 03-25-14: urine culture: no growth 07-24-14: wbc 10.7;  hgb 10.6; hct 34.5; mcv 85.3; plt 218; glucose 94; bun 15.8; creat 0.54; k+4.2; na++143; liver normal albumin 3.9; hgb a1c 5.8; vit d 26.33 08-28-14: chol 90; ldl 55; trig 69; hdl 21 08-29-14: urine micro-albumin 1.2  10-31-14: hgb a1c 5.9       Review of Systems Constitutional: Negative for malaise/fatigue.  Respiratory: Negative for  shortness of breath.    Cardiovascular: Negative for chest pain, palpitations and leg swelling.  Gastrointestinal: Negative for abdominal pain and constipation.  Musculoskeletal: negative for myalgias . Negative for joint pain.  Skin: Negative.   Neurological: Negative for headaches.  Psychiatric/Behavioral: The patient is not nervous/anxious.     Physical Exam Constitutional: He appears well-developed and well-nourished. No distress.  Neck: Neck supple. No JVD present. No thyromegaly present.  Bilateral carotid bruits   Cardiovascular: Normal rate, regular rhythm and intact distal pulses.   Murmur heard. Respiratory: Effort normal and breath sounds normal. No respiratory distress.  GI: Soft. Bowel sounds are normal. He exhibits no distension.  Musculoskeletal: He exhibits no edema.  Has no movement to left upper extremity Has some movement to left lower extremity Able to move right extremities   Neurological: He is alert.  Skin: Skin is warm and dry. He is not diaphoretic.      ASSESSMENT/ PLAN:  1. Gout: no recent flares present will continue allopurinol 300 mg daily   2. Dyslipidemia: will continue lipitor 80 mg daily; ldl is 55  3. Hypertension: will  coreg 6.25 mg twice daily and asa 81 mg daily and will continue  hydralazine  25 mg three times daily  will increase his cozaar to 50 mg daily and will have nursing staff check blood pressure twice daily    4. CAD: no complaints of chest pain present; will continue asa 81 mg dialy ; imdur 30 mg daily; plavix  75 mg daily and ntg prn  5. CVA with left hemiparesis: neurologically he remains stable;  will continue asa 81 mg daily; plavix 75 mg daily will continue  baclofen 20 mg four times daily for spasticity  And will continue robaxin 500 mg nightly will monitor.   6. Depression: will continue zoloft 175 mg nightly   7. Vit d deficiency: will continue  vit d 50,000 units weekly  Last vit d level 26.33   8. Diabetes: he  is presently not on medications; does take arb; asa and statin. hgb a1c is 5.9 his micro-albumin is 1.2   9. Dysphagia: is on nectar thick liquids; no signs of aspiration present; will not make changes will monitor   10. GERD: will continue prilosec 20 mg daily    In one week will check cbc; cmp; vit d hgb a1c and lipds       Synthia Innocent NP [facility]  Contact [phone] Monday through Friday 8am- 5pm  After hours call [phone]

## 2015-03-30 ENCOUNTER — Non-Acute Institutional Stay (SKILLED_NURSING_FACILITY): Payer: Medicare Other | Admitting: Internal Medicine

## 2015-03-30 DIAGNOSIS — I25110 Atherosclerotic heart disease of native coronary artery with unstable angina pectoris: Secondary | ICD-10-CM | POA: Diagnosis not present

## 2015-03-30 DIAGNOSIS — F329 Major depressive disorder, single episode, unspecified: Secondary | ICD-10-CM

## 2015-03-30 DIAGNOSIS — F32A Depression, unspecified: Secondary | ICD-10-CM

## 2015-03-30 DIAGNOSIS — I6930 Unspecified sequelae of cerebral infarction: Secondary | ICD-10-CM

## 2015-03-30 NOTE — Progress Notes (Signed)
MRN: [id number] Name: Dylan Malone  Sex: male Age: 70 y.o. DOB: [birth year]  PSC #: [name] Facility/Room:102 Level Of Care: SNF Provider: [name] Emergency Contacts: Extended Emergency Contact Information Primary Emergency Contact: Hammonds,Jomayra Address: [address]          [place], [place] [address] D[place] of [place] Home Phone: [phone] Relation: Daughter  Code Status:   Allergies: Ace inhibitors  Chief Complaint  Patient presents with  . Medical Management of Chronic Issues    HPI: Patient is 71 y.o. male who is being seen for routine issues of CAD, CVA and depression.  Past Medical History  Diagnosis Date  . Stroke [facility])     a. wheelchair bound and swallowing difficulties  . Hypertension   . Diabetes mellitus without complication (HCC)   . Ischemic cardiomyopathy     a. 03/01/14: 2D ECHO w/ LVEF 35-40%, mod LVH, inferosept/inf hypokinesis,  . CAD (coronary artery disease)     a. 03/04/14 [place] s/p DES to mLAD and DES to [facility]  . Aortic stenosis     a. 03/01/14 ECHO moderate with AVA 1.1, Pk grad 32.  Marland Kitchen HLD (hyperlipidemia)     Past Surgical History  Procedure Laterality Date  . Left heart catheterization with coronary angiogram N/A 03/03/2014    Procedure: LEFT HEART CATHETERIZATION WITH CORONARY ANGIOGRAM;  Surgeon: Kathleene Hazel, MD;  Location: [facility] CATH LAB;  Service: Cardiovascular;  Laterality: N/A;  . Percutaneous coronary stent intervention (pci-s) N/A 03/04/2014    Procedure: PERCUTANEOUS CORONARY STENT INTERVENTION (PCI-S);  Surgeon: Marykay Lex, MD;  Location: [facility] CATH LAB;  Service: Cardiovascular;  Laterality: N/A;      Medication List       This list is accurate as of: 03/30/15 11:59 PM.  Always use your most recent med list.               acetaminophen 500 MG tablet  Commonly known as:  TYLENOL  Take 1,000 mg by mouth 2 (two) times daily. And daily as needed     allopurinol 300 MG tablet  Commonly known as:   ZYLOPRIM  Take 300 mg by mouth daily.     aspirin EC 81 MG tablet  Take 81 mg by mouth daily.     atorvastatin 80 MG tablet  Commonly known as:  LIPITOR  Take 1 tablet (80 mg total) by mouth daily at 6 PM.     baclofen 20 MG tablet  Commonly known as:  LIORESAL  Take 20 mg by mouth 4 (four) times daily.     benzonatate 100 MG capsule  Commonly known as:  TESSALON  Take by mouth 2 (two) times daily as needed for cough.     carvedilol 6.25 MG tablet  Commonly known as:  COREG  Take 1 tablet (6.25 mg total) by mouth 2 (two) times daily with a meal.     clopidogrel 75 MG tablet  Commonly known as:  PLAVIX  Take 1 tablet (75 mg total) by mouth daily.     food thickener Powd  Commonly known as:  THICK IT  Take 1 Container by mouth as needed. Nectar thick     hydrALAZINE 25 MG tablet  Commonly known as:  APRESOLINE  Take 1 tablet (25 mg total) by mouth every 8 (eight) hours.     isosorbide mononitrate 30 MG 24 hr tablet  Commonly known as:  IMDUR  Take 1 tablet (30 mg total) by mouth daily.  losartan 25 MG tablet  Commonly known as:  COZAAR  Take 1 tablet (25 mg total) by mouth daily.     methocarbamol 500 MG tablet  Commonly known as:  ROBAXIN  Take 500 mg by mouth at bedtime.     nitroGLYCERIN 0.4 MG SL tablet  Commonly known as:  NITROSTAT  Place 1 tablet (0.4 mg total) under the tongue every 5 (five) minutes x 3 doses as needed for chest pain.     omeprazole 20 MG capsule  Commonly known as:  PRILOSEC  Take 20 mg by mouth daily.     sertraline 100 MG tablet  Commonly known as:  ZOLOFT  Take 175 mg by mouth at bedtime.     Vitamin D (Ergocalciferol) 50000 units Caps capsule  Commonly known as:  DRISDOL  Take 50,000 Units by mouth every 7 (seven) days.        No orders of the defined types were placed in this encounter.    Immunization History  Administered Date(s) Administered  . PPD Test 03/05/2014    Social History  Substance Use Topics  .  Smoking status: Never Smoker   . Smokeless tobacco: Not on file  . Alcohol Use: No    Review of Systems  DATA OBTAINED: from patient, nurse GENERAL:  no fevers, fatigue, appetite changes SKIN: No itching, rash HEENT: No complaint RESPIRATORY: No cough, wheezing, SOB CARDIAC: No chest pain, palpitations, lower extremity edema  GI: No abdominal pain, No N/V/D or constipation, No heartburn or reflux  GU: No dysuria, frequency or urgency, or incontinence  MUSCULOSKELETAL: c/o pain L side ( this is chronic) NEUROLOGIC: No headache, dizziness  PSYCHIATRIC: No overt anxiety or sadness  Filed Vitals:   04/04/15 1908  BP: 147/61  Pulse: 58  Temp: 98.3 F (36.8 C)  Resp: 17    Physical Exam  GENERAL APPEARANCE: Alert, mod conversant, No acute distress  SKIN: No diaphoresis rash HEENT: Unremarkable RESPIRATORY: Breathing is even, unlabored. Lung sounds are clear   CARDIOVASCULAR: Heart RRR no murmurs, rubs or gallops. No peripheral edema  GASTROINTESTINAL: Abdomen is soft, non-tender, not distended w/ normal bowel sounds.  GENITOURINARY: Bladder non tender, not distended  MUSCULOSKELETAL: No abnormal joints or musculature NEUROLOGIC: Cranial nerves 2-12 grossly intact; L hemiparesis PSYCHIATRIC: Mildly depressed, no behavioral issues  Patient Active Problem List   Diagnosis Date Noted  . Benign hypertensive heart disease without heart failure 12/20/2014  . Dysphagia, pharyngoesophageal phase 11/19/2014  . Muscle spasticity 09/25/2014  . Pain in limb 09/25/2014  . Hemiparesis affecting left side as late effect of stroke (HCC) 06/12/2014  . Gout, chronic, without tophus 06/08/2014  . Depression 06/08/2014  . Vitamin D deficiency 06/08/2014  . Essential hypertension 03/09/2014  . History of CVA with residual deficit 03/09/2014  . Aortic stenosis   . CAD (coronary artery disease)   . Ischemic cardiomyopathy   . Diabetes mellitus without complication (HCC)   . Stroke (HCC)    . Hyperlipidemia 03/01/2014  . NSTEMI (non-ST elevated myocardial infarction) (HCC) 02/28/2014    CBC    Component Value Date/Time   WBC 10.7 07/24/2014   WBC 8.9 03/05/2014 0300   RBC 3.63* 03/05/2014 0300   HGB 10.6* 07/24/2014   HCT 34* 07/24/2014   PLT 218 07/24/2014   MCV 83.7 03/05/2014 0300    CMP     Component Value Date/Time   NA 143 07/24/2014   NA 139 03/05/2014 0300   K 4.2 07/24/2014   CL  108 03/05/2014 0300   CO2 25 03/05/2014 0300   GLUCOSE 98 03/05/2014 0300   BUN 16 07/24/2014   BUN 14 03/05/2014 0300   CREATININE 0.5* 07/24/2014   CREATININE 0.75 03/05/2014 0300   CALCIUM 9.0 03/05/2014 0300   PROT 9.1* 02/28/2014 1653   ALBUMIN 4.7 02/28/2014 1653   AST 18 07/24/2014   ALT 21 07/24/2014   ALKPHOS 113 07/24/2014   BILITOT 0.8 02/28/2014 1653   GFRNONAA >90 03/05/2014 0300   GFRAA >90 03/05/2014 0300    Assessment and Plan  CAD (coronary artery disease) no complaints of chest pain present; will continue asa 81 mg dialy ; imdur 30 mg daily; plavix 75 mg daily and ntg prn  History of CVA with residual deficit Neurologically he remains stable; will continue asa 81 mg daily; plavix 75 mg daily will continue baclofen 20 mg four times daily for spasticity And will continue robaxin 500 mg nightly will monitor.    Depression Chronic and stable; continue zoloft 175 mg nightly     Margit Hanks, MD

## 2015-04-04 ENCOUNTER — Encounter: Payer: Self-pay | Admitting: Internal Medicine

## 2015-04-04 NOTE — Assessment & Plan Note (Signed)
Chronic and stable; continue zoloft 175 mg nightly

## 2015-04-04 NOTE — Assessment & Plan Note (Signed)
Neurologically he remains stable; will continue asa 81 mg daily; plavix 75 mg daily will continue baclofen 20 mg four times daily for spasticity And will continue robaxin 500 mg nightly will monitor.

## 2015-04-04 NOTE — Assessment & Plan Note (Signed)
no complaints of chest pain present; will continue asa 81 mg dialy ; imdur 30 mg daily; plavix 75 mg daily and ntg prn

## 2015-04-27 LAB — BASIC METABOLIC PANEL: Glucose: 88 mg/dL

## 2015-04-29 ENCOUNTER — Non-Acute Institutional Stay (SKILLED_NURSING_FACILITY): Payer: Medicare Other | Admitting: Adult Health

## 2015-04-29 ENCOUNTER — Encounter: Payer: Self-pay | Admitting: Adult Health

## 2015-04-29 DIAGNOSIS — I69354 Hemiplegia and hemiparesis following cerebral infarction affecting left non-dominant side: Secondary | ICD-10-CM

## 2015-04-29 DIAGNOSIS — R1314 Dysphagia, pharyngoesophageal phase: Secondary | ICD-10-CM | POA: Diagnosis not present

## 2015-04-29 DIAGNOSIS — E1169 Type 2 diabetes mellitus with other specified complication: Secondary | ICD-10-CM | POA: Diagnosis not present

## 2015-04-29 DIAGNOSIS — E1149 Type 2 diabetes mellitus with other diabetic neurological complication: Secondary | ICD-10-CM

## 2015-04-29 DIAGNOSIS — M1A9XX0 Chronic gout, unspecified, without tophus (tophi): Secondary | ICD-10-CM

## 2015-04-29 DIAGNOSIS — I639 Cerebral infarction, unspecified: Secondary | ICD-10-CM | POA: Diagnosis not present

## 2015-04-29 DIAGNOSIS — I119 Hypertensive heart disease without heart failure: Secondary | ICD-10-CM

## 2015-04-29 DIAGNOSIS — E785 Hyperlipidemia, unspecified: Secondary | ICD-10-CM | POA: Diagnosis not present

## 2015-04-29 LAB — VITAMIN D 1,25 DIHYDROXY: VIT D 25 HYDROXY: 25.21

## 2015-04-29 NOTE — Progress Notes (Signed)
Patient ID: Dylan Malone, male   DOB: [birth year], [age] y.o.   MRN: [id number]   Facility:  [facility]       Allergies  Allergen Reactions  . Ace Inhibitors   . Vasotec [Enalapril Maleate]     Chief Complaint  Patient presents with  . Medical Management of Chronic Issues    H&P    HPI:  He is a long term resident of this facility being seen for his annual exam. He has not been hospitalized over the past several months. He tells me that he may be moving with [place] to [place] over the month or so. There are no nursing concerns today. He is not voicing any concerns.    Past Medical History  Diagnosis Date  . Stroke [facility](HCC)     a. wheelchair bound and swallowing difficulties  . Hypertension   . Diabetes mellitus without complication (HCC)   . Ischemic cardiomyopathy     a. 03/01/14: 2D ECHO w/ LVEF 35-40%, mod LVH, inferosept/inf hypokinesis,  . CAD (coronary artery disease)     a. 03/04/14 [place] s/[place] DES [facility]  . Aortic stenosis     a. 03/01/14 ECHO moderate with AVA 1.1, Pk grad 32.  Marland Kitchen. HLD (hyperlipidemia)     Past Surgical History  Procedure Laterality Date  . Left heart catheterization with coronary angiogram N/A 03/03/2014    Procedure: LEFT HEART CATHETERIZATION WITH CORONARY ANGIOGRAM;  Surgeon: Kathleene Hazelhristopher D McAlhany, MD;  [facility];  Service: Cardiovascular;  Laterality: N/A;  . Percutaneous coronary stent intervention (pci-s) N/A 03/04/2014    Procedure: PERCUTANEOUS CORONARY STENT INTERVENTION (PCI-S);  Surgeon: Marykay Lexavid W Harding, MD;  [facility];  Service: Cardiovascular;  Laterality: N/A;   History reviewed. No pertinent family history.  Social History   Social History  . Marital Status: Divorced    Spouse Name: N/A  . Number of Children: N/A  . Years of Education: N/A   Occupational History  . Not on file.   Social History Main Topics  . Smoking status: Never Smoker   . Smokeless tobacco: Not on file  . Alcohol Use: No    . Drug Use: Not on file  . Sexual Activity: Not on file   Other Topics Concern  . Not on file   Social History Narrative      VITAL SIGNS BP 177/80 mmHg  Pulse 54  Temp(Src) 97.3 F (36.3 C) (Oral)  Resp 17  Ht 5\' 7"  (1.702 m)  Wt 170 lb (77.111 kg)  BMI 26.62 kg/m2  SpO2 97%  Patient's Medications  New Prescriptions   No medications on file  Previous Medications   ACETAMINOPHEN (TYLENOL) 500 MG TABLET    Take 1,000 mg by mouth 2 (two) times daily. And daily as needed   ALLOPURINOL (ZYLOPRIM) 300 MG TABLET    Take 300 mg by mouth daily.   ASPIRIN EC 81 MG TABLET    Take 81 mg by mouth daily.   ATORVASTATIN (LIPITOR) 80 MG TABLET    Take 1 tablet (80 mg total) by mouth daily at 6 PM.   BACLOFEN (LIORESAL) 20 MG TABLET    Take 20 mg by mouth 4 (four) times daily.   BENZONATATE (TESSALON) 100 MG CAPSULE    Take by mouth 2 (two) times daily as needed for cough.   CARVEDILOL (COREG) 6.25 MG TABLET    Take 1 tablet (6.25 mg total) by mouth 2 (two) times daily  with a meal.   CLOPIDOGREL (PLAVIX) 75 MG TABLET    Take 1 tablet (75 mg total) by mouth daily.   FOOD THICKENER (THICK IT) POWD    Take 1 Container by mouth as needed. Nectar thick   HYDRALAZINE (APRESOLINE) 25 MG TABLET    Take 1 tablet (25 mg total) by mouth every 8 (eight) hours.   ISOSORBIDE MONONITRATE (IMDUR) 30 MG 24 HR TABLET    Take 1 tablet (30 mg total) by mouth daily.   LOSARTAN (COZAAR) 25 MG TABLET    Take 1 tablet (25 mg total) by mouth daily.   METHOCARBAMOL (ROBAXIN) 500 MG TABLET    Take 500 mg by mouth at bedtime.   NITROGLYCERIN (NITROSTAT) 0.4 MG SL TABLET    Place 1 tablet (0.4 mg total) under the tongue every 5 (five) minutes x 3 doses as needed for chest pain.   OMEPRAZOLE (PRILOSEC) 20 MG CAPSULE    Take 20 mg by mouth daily.   SERTRALINE (ZOLOFT) 100 MG TABLET    Take 175 mg by mouth at bedtime.    VITAMIN D, ERGOCALCIFEROL, (DRISDOL) 50000 UNITS CAPS CAPSULE    Take 50,000 Units by mouth every 7  (seven) days.  Modified Medications   No medications on file  Discontinued Medications   No medications on file     SIGNIFICANT DIAGNOSTIC EXAMS  09-30-14: swallow study: Intermittent laryngeal penetration and frank tracheal aspiration with preserved cough reflex with thin and nectar thick liquids, improved with chin tuck.     LABS REVIEWED:   07-24-14: wbc 10.7;  hgb 10.6; hct 34.5; mcv 85.3; plt 218; glucose 94; bun 15.8; creat 0.54; k+4.2; na++143; liver normal albumin 3.9; hgb a1c 5.8; vit d 26.33 08-28-14: chol 90; ldl 55; trig 69; hdl 21 08-29-14: urine micro-albumin 1.2  10-31-14: hgb a1c 5.9  03-10-15: wbc 8.0; hgb 10.8; hct 34.1; mcv 88.0; plt 177; glucose 102; bun 22.6; creat 0.54; k+ 3.7; na++146; liver normal albumin 3.8; hgb a1c 5.9; chol 91; ldl 36; trig 158; hdl 24;  04-27-15: vit D 25.21       Review of Systems Constitutional: Negative for malaise/fatigue.  Respiratory: Negative for  shortness of breath.    Cardiovascular: Negative for chest pain, palpitations and leg swelling.  Gastrointestinal: Negative for abdominal pain and constipation.  Musculoskeletal: negative for myalgias . Negative for joint pain.  Skin: Negative.   Neurological: Negative for headaches.  Psychiatric/Behavioral: The patient is not nervous/anxious.     Physical Exam Constitutional: He appears well-developed and well-nourished. No distress.  Neck: Neck supple. No JVD present. No thyromegaly present.  Bilateral carotid bruits   Cardiovascular: Normal rate, regular rhythm and intact distal pulses.   Murmur heard. Respiratory: Effort normal and breath sounds normal. No respiratory distress.  GI: Soft. Bowel sounds are normal. He exhibits no distension.  Musculoskeletal: He exhibits no edema.  Has no movement to left upper extremity Has some movement to left lower extremity Able to move right extremities   Neurological: He is alert.  Skin: Skin is warm and dry. He is not diaphoretic.       ASSESSMENT/ PLAN:  1. Gout: no recent flares present will continue allopurinol 300 mg daily   2. Dyslipidemia: will lower lipitor to 40 mg daily; ldl is 36 will begin fish oil 1 gm daily trig are 158   3. Hypertension: will  coreg 6.25 mg twice daily and asa 81 mg daily and will continue  hydralazine  25 mg  three times daily  will increase  his cozaar to 100 mg daily and will have nursing staff check blood pressure twice daily  4. CAD: no complaints of chest pain present; will continue asa 81 mg dialy ; imdur 30 mg daily; plavix 75 mg daily and ntg prn  5. CVA with left hemiparesis: neurologically he remains stable;  will continue asa 81 mg daily; plavix 75 mg daily will continue  baclofen 20 mg four times daily for spasticity  And will continue robaxin 500 mg nightly will monitor.   6. Depression: will continue zoloft 175 mg nightly   7. Vit d deficiency: will continue  vit d 50,000 units weekly  Last vit d level 25.21   8. Diabetes: he is presently not on medications; does take arb; asa and statin. hgb a1c is 5.9 his micro-albumin is 1.2   9. Dysphagia: is on nectar thick liquids; no signs of aspiration present; will not make changes will monitor   10. GERD: will continue prilosec 20 mg daily       Will check bmp in 2 weeks  His health maintenance is up to date.    Time spent with patient  40  minutes >50% time spent counseling; reviewing medical record; tests; labs; and developing future plan of care   Synthia Innocent NP [facility] Adult Medicine  Contact [phone] Monday through Friday 8am- 5pm  After hours call [phone]

## 2015-05-27 DIAGNOSIS — E1149 Type 2 diabetes mellitus with other diabetic neurological complication: Secondary | ICD-10-CM | POA: Insufficient documentation

## 2015-05-27 DIAGNOSIS — E1169 Type 2 diabetes mellitus with other specified complication: Secondary | ICD-10-CM | POA: Insufficient documentation

## 2015-05-27 DIAGNOSIS — E785 Hyperlipidemia, unspecified: Secondary | ICD-10-CM

## 2015-05-28 ENCOUNTER — Encounter: Payer: Self-pay | Admitting: Internal Medicine

## 2015-05-28 ENCOUNTER — Non-Acute Institutional Stay (SKILLED_NURSING_FACILITY): Payer: Medicare Other | Admitting: Internal Medicine

## 2015-05-28 DIAGNOSIS — M1A9XX0 Chronic gout, unspecified, without tophus (tophi): Secondary | ICD-10-CM | POA: Diagnosis not present

## 2015-05-28 DIAGNOSIS — F329 Major depressive disorder, single episode, unspecified: Secondary | ICD-10-CM

## 2015-05-28 DIAGNOSIS — I25110 Atherosclerotic heart disease of native coronary artery with unstable angina pectoris: Secondary | ICD-10-CM

## 2015-05-28 DIAGNOSIS — I255 Ischemic cardiomyopathy: Secondary | ICD-10-CM | POA: Diagnosis not present

## 2015-05-28 DIAGNOSIS — I6930 Unspecified sequelae of cerebral infarction: Secondary | ICD-10-CM | POA: Diagnosis not present

## 2015-05-28 DIAGNOSIS — E1169 Type 2 diabetes mellitus with other specified complication: Secondary | ICD-10-CM

## 2015-05-28 DIAGNOSIS — E785 Hyperlipidemia, unspecified: Secondary | ICD-10-CM | POA: Diagnosis not present

## 2015-05-28 DIAGNOSIS — I10 Essential (primary) hypertension: Secondary | ICD-10-CM | POA: Diagnosis not present

## 2015-05-28 DIAGNOSIS — E1149 Type 2 diabetes mellitus with other diabetic neurological complication: Secondary | ICD-10-CM

## 2015-05-28 DIAGNOSIS — R1314 Dysphagia, pharyngoesophageal phase: Secondary | ICD-10-CM

## 2015-05-28 DIAGNOSIS — I350 Nonrheumatic aortic (valve) stenosis: Secondary | ICD-10-CM

## 2015-05-28 DIAGNOSIS — F32A Depression, unspecified: Secondary | ICD-10-CM

## 2015-05-28 NOTE — Progress Notes (Signed)
MRN: [id number] Name: Dylan Malone  Sex: male Age: 70 y.o. DOB: [birth year]  PSC #: [name] Facility/Room:102 Level Of Care: SNF Provider: [name] Emergency Contacts: Extended Emergency Contact Information Primary Emergency Contact: Toran,Jomayra Address: [address]          [place], [place] [address] [place] of [place] Home Phone: [phone] Relation: Daughter  Code Status:   Allergies: Ace inhibitors and Vasotec  Chief Complaint  Patient presents with  . Discharge Note    HPI: Patient is 70 y.o. male who is s/p stroke, with HTN, CAD, HLD, CHF who was admitted to [facility] 03/05/2014 who is being d/c to home.  Past Medical History  Diagnosis Date  . Stroke [facility])     a. wheelchair bound and swallowing difficulties  . Hypertension   . Diabetes mellitus without complication (HCC)   . Ischemic cardiomyopathy     a. 03/01/14: 2D ECHO w/ LVEF 35-40%, mod LVH, inferosept/inf hypokinesis,  . CAD (coronary artery disease)     a. 03/04/14 [place] s/p DES to mLAD and DES to [facility]  . Aortic stenosis     a. 03/01/14 ECHO moderate with AVA 1.1, Pk grad 32.  Marland Kitchen HLD (hyperlipidemia)     Past Surgical History  Procedure Laterality Date  . Left heart catheterization with coronary angiogram N/A 03/03/2014    Procedure: LEFT HEART CATHETERIZATION WITH CORONARY ANGIOGRAM;  Surgeon: Kathleene Hazel, MD;  Location: [facility] CATH LAB;  Service: Cardiovascular;  Laterality: N/A;  . Percutaneous coronary stent intervention (pci-s) N/A 03/04/2014    Procedure: PERCUTANEOUS CORONARY STENT INTERVENTION (PCI-S);  Surgeon: Marykay Lex, MD;  Location: [facility] CATH LAB;  Service: Cardiovascular;  Laterality: N/A;      Medication List       This list is accurate as of: 05/28/15  1:32 PM.  Always use your most recent med list.               acetaminophen 500 MG tablet  Commonly known as:  TYLENOL  Take 1,000 mg by mouth 2 (two) times daily. And daily as needed     allopurinol 300 MG  tablet  Commonly known as:  ZYLOPRIM  Take 300 mg by mouth daily.     aspirin EC 81 MG tablet  Take 81 mg by mouth daily.     atorvastatin 80 MG tablet  Commonly known as:  LIPITOR  Take 1 tablet (80 mg total) by mouth daily at 6 PM.     baclofen 20 MG tablet  Commonly known as:  LIORESAL  Take 20 mg by mouth 4 (four) times daily.     benzonatate 100 MG capsule  Commonly known as:  TESSALON  Take by mouth 2 (two) times daily as needed for cough.     carvedilol 6.25 MG tablet  Commonly known as:  COREG  Take 1 tablet (6.25 mg total) by mouth 2 (two) times daily with a meal.     clopidogrel 75 MG tablet  Commonly known as:  PLAVIX  Take 1 tablet (75 mg total) by mouth daily.     food thickener Powd  Commonly known as:  THICK IT  Take 1 Container by mouth as needed. Nectar thick     hydrALAZINE 25 MG tablet  Commonly known as:  APRESOLINE  Take 1 tablet (25 mg total) by mouth every 8 (eight) hours.     isosorbide mononitrate 30 MG 24 hr tablet  Commonly known as:  IMDUR  Take 1  tablet (30 mg total) by mouth daily.     losartan 25 MG tablet  Commonly known as:  COZAAR  Take 1 tablet (25 mg total) by mouth daily.     methocarbamol 500 MG tablet  Commonly known as:  ROBAXIN  Take 500 mg by mouth at bedtime.     nitroGLYCERIN 0.4 MG SL tablet  Commonly known as:  NITROSTAT  Place 1 tablet (0.4 mg total) under the tongue every 5 (five) minutes x 3 doses as needed for chest pain.     omeprazole 20 MG capsule  Commonly known as:  PRILOSEC  Take 20 mg by mouth daily.     sertraline 100 MG tablet  Commonly known as:  ZOLOFT  Take 175 mg by mouth at bedtime.     Vitamin D (Ergocalciferol) 50000 units Caps capsule  Commonly known as:  DRISDOL  Take 50,000 Units by mouth every 7 (seven) days.        No orders of the defined types were placed in this encounter.    Immunization History  Administered Date(s) Administered  . PPD Test 03/05/2014    Social History   Substance Use Topics  . Smoking status: Never Smoker   . Smokeless tobacco: Not on file  . Alcohol Use: No    Filed Vitals:   05/28/15 1328  BP: 127/50  Pulse: 60  Temp: 98.1 F (36.7 C)  Resp: 17    Physical Exam  GENERAL APPEARANCE: Alert, conversant. No acute distress.  HEENT: Unremarkable. RESPIRATORY: Breathing is even, unlabored. Lung sounds are clear   CARDIOVASCULAR: Heart RRR no murmurs, rubs or gallops. No peripheral edema.  GASTROINTESTINAL: Abdomen is soft, non-tender, not distended w/ normal bowel sounds.  NEUROLOGIC: Cranial nerves 2-12 grossly intact; L side hemiparesis  Patient Active Problem List   Diagnosis Date Noted  . Type II diabetes mellitus with neurological manifestations (HCC) 05/27/2015  . Dyslipidemia associated with type 2 diabetes mellitus (HCC) 05/27/2015  . Benign hypertensive heart disease without heart failure 12/20/2014  . Dysphagia, pharyngoesophageal phase 11/19/2014  . Muscle spasticity 09/25/2014  . Pain in limb 09/25/2014  . Hemiparesis affecting left side as late effect of stroke (HCC) 06/12/2014  . Gout, chronic, without tophus 06/08/2014  . Depression 06/08/2014  . Vitamin D deficiency 06/08/2014  . Essential hypertension 03/09/2014  . History of CVA with residual deficit 03/09/2014  . Aortic stenosis   . CAD (coronary artery disease)   . Ischemic cardiomyopathy   . Stroke (HCC)   . Hyperlipidemia 03/01/2014  . NSTEMI (non-ST elevated myocardial infarction) (HCC) 02/28/2014    CBC    Component Value Date/Time   WBC 10.7 07/24/2014   WBC 8.9 03/05/2014 0300   RBC 3.63* 03/05/2014 0300   HGB 10.6* 07/24/2014   HCT 34* 07/24/2014   PLT 218 07/24/2014   MCV 83.7 03/05/2014 0300    CMP     Component Value Date/Time   NA 146 03/10/2015   NA 139 03/05/2014 0300   K 3.7 03/10/2015   CL 108 03/05/2014 0300   CO2 25 03/05/2014 0300   GLUCOSE 98 03/05/2014 0300   BUN 23* 03/10/2015   BUN 14 03/05/2014 0300    CREATININE 0.5* 03/10/2015   CREATININE 0.75 03/05/2014 0300   CALCIUM 9.0 03/05/2014 0300   PROT 9.1* 02/28/2014 1653   ALBUMIN 4.7 02/28/2014 1653   AST 17 03/10/2015   ALT 17 03/10/2015   ALKPHOS 130* 03/10/2015   BILITOT 0.8 02/28/2014 1653  GFRNONAA >90 03/05/2014 0300   GFRAA >90 03/05/2014 0300    Assessment and Plan  Pt is d/c to home with HH/OT/PT/ST. Medications have been reconciled and rx's written, DME's ordered.  Time spent > 30 min;> 50% of time with patient was spent reviewing records, labs, tests and studies, counseling and developing plan of care  Margit Hanks, MD

## 2015-05-30 DIAGNOSIS — R1314 Dysphagia, pharyngoesophageal phase: Secondary | ICD-10-CM

## 2015-05-30 DIAGNOSIS — I69352 Hemiplegia and hemiparesis following cerebral infarction affecting left dominant side: Secondary | ICD-10-CM

## 2015-05-30 DIAGNOSIS — I69391 Dysphagia following cerebral infarction: Secondary | ICD-10-CM | POA: Diagnosis not present

## 2015-05-30 DIAGNOSIS — K5900 Constipation, unspecified: Secondary | ICD-10-CM | POA: Diagnosis not present

## 2015-05-30 DIAGNOSIS — I10 Essential (primary) hypertension: Secondary | ICD-10-CM | POA: Diagnosis not present

## 2017-04-12 IMAGING — RF DG SWALLOWING FUNCTION
2 series · 2 of 2 positions shown · non-contrast
Comparison: None.

CLINICAL DATA: 69-year-old male with history of CVA, dysphagia and
coughing.

EXAM:
MODIFIED BARIUM SWALLOW
TECHNIQUE: Different consistencies of barium were administered orally to the
patient by the Speech Pathologist. Imaging of the pharynx was
performed in the lateral projection.
FLUOROSCOPY TIME:  Fluoroscopy Time:  1 minutes 50 seconds.
Number of Acquired Images:  2 screen captures.

[Series 1: run · 1 of 1 slices shown (1 of 2)]
[im 1/1]
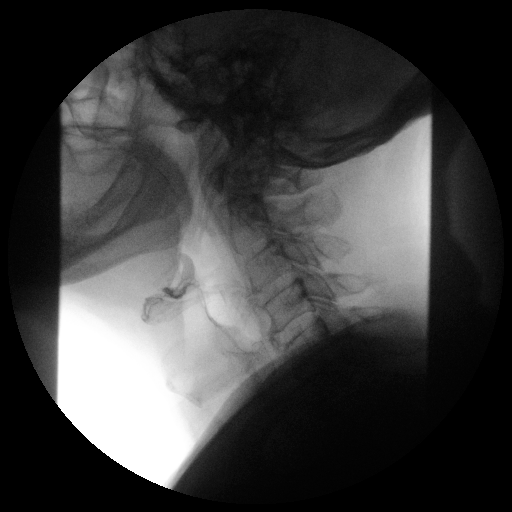

[Series 1: run · 1 of 1 slices shown (2 of 2)]
[im 1/1]
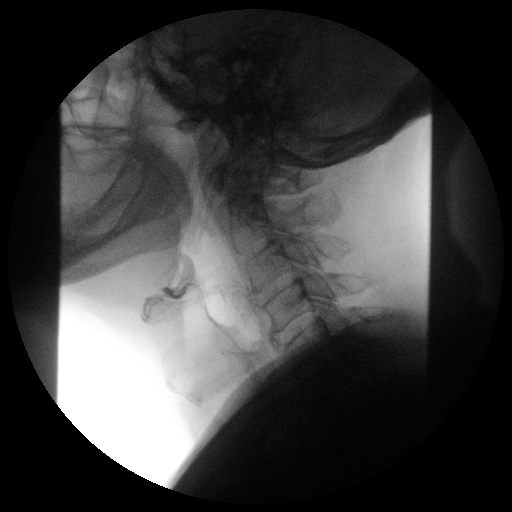

[2 of 2 positions shown; findings below may reference images not displayed]

FINDINGS: Thin liquid- intermittent delayed epiglottic tilt with laryngeal
penetration with preserved cough reflex, slightly improved with the
chin-tuck maneuver. Mild vallecular retention.

Nectar thick liquid- intermittent frank tracheal aspiration due to
delayed epiglottic tilt. Preserved cough reflex. Persistent
intermittent laryngeal penetration with chin-tuck. Mild vallecular
retention.

Honey- not assessed.

Amirul?Pierro within normal limits

Amirul?Dhat Chick with cracker- within normal limits

Barium tablet -  within normal limits
IMPRESSION: Intermittent laryngeal penetration and frank tracheal aspiration
with preserved cough reflex with thin and nectar thick liquids,
improved with chin tuck.

Please refer to the Speech Pathologists report for complete details
and recommendations.
